# Patient Record
Sex: Male | Born: 1980 | Race: White | Hispanic: No | Marital: Single | Smoking: Current every day smoker
Health system: Southern US, Community
[De-identification: ages and names within clinical notes are randomized; demographics above are authoritative.]

## PROBLEM LIST (undated history)

## (undated) DIAGNOSIS — B1920 Unspecified viral hepatitis C without hepatic coma: Secondary | ICD-10-CM

## (undated) DIAGNOSIS — B009 Herpesviral infection, unspecified: Secondary | ICD-10-CM

## (undated) DIAGNOSIS — F1910 Other psychoactive substance abuse, uncomplicated: Secondary | ICD-10-CM

## (undated) HISTORY — PX: HAND SURGERY: SHX662

---

## 2011-10-09 ENCOUNTER — Encounter (HOSPITAL_BASED_OUTPATIENT_CLINIC_OR_DEPARTMENT_OTHER): Payer: Self-pay | Admitting: *Deleted

## 2011-10-09 ENCOUNTER — Emergency Department (INDEPENDENT_AMBULATORY_CARE_PROVIDER_SITE_OTHER): Payer: BC Managed Care – PPO

## 2011-10-09 ENCOUNTER — Emergency Department (HOSPITAL_BASED_OUTPATIENT_CLINIC_OR_DEPARTMENT_OTHER)
Admission: EM | Admit: 2011-10-09 | Discharge: 2011-10-09 | Disposition: A | Discharge status: Home / Self Care | Payer: BC Managed Care – PPO | Attending: Emergency Medicine | Admitting: Emergency Medicine

## 2011-10-09 DIAGNOSIS — S0990XA Unspecified injury of head, initial encounter: Secondary | ICD-10-CM

## 2011-10-09 DIAGNOSIS — F1120 Opioid dependence, uncomplicated: Secondary | ICD-10-CM | POA: Insufficient documentation

## 2011-10-09 DIAGNOSIS — R112 Nausea with vomiting, unspecified: Secondary | ICD-10-CM | POA: Insufficient documentation

## 2011-10-09 DIAGNOSIS — R61 Generalized hyperhidrosis: Secondary | ICD-10-CM | POA: Insufficient documentation

## 2011-10-09 DIAGNOSIS — R1110 Vomiting, unspecified: Secondary | ICD-10-CM

## 2011-10-09 DIAGNOSIS — F19939 Other psychoactive substance use, unspecified with withdrawal, unspecified: Secondary | ICD-10-CM | POA: Insufficient documentation

## 2011-10-09 DIAGNOSIS — F17200 Nicotine dependence, unspecified, uncomplicated: Secondary | ICD-10-CM | POA: Insufficient documentation

## 2011-10-09 DIAGNOSIS — X58XXXA Exposure to other specified factors, initial encounter: Secondary | ICD-10-CM

## 2011-10-09 DIAGNOSIS — R569 Unspecified convulsions: Secondary | ICD-10-CM

## 2011-10-09 DIAGNOSIS — R109 Unspecified abdominal pain: Secondary | ICD-10-CM | POA: Insufficient documentation

## 2011-10-09 DIAGNOSIS — F1123 Opioid dependence with withdrawal: Secondary | ICD-10-CM

## 2011-10-09 DIAGNOSIS — R197 Diarrhea, unspecified: Secondary | ICD-10-CM | POA: Insufficient documentation

## 2011-10-09 DIAGNOSIS — F1193 Opioid use, unspecified with withdrawal: Secondary | ICD-10-CM

## 2011-10-09 DIAGNOSIS — R6883 Chills (without fever): Secondary | ICD-10-CM | POA: Insufficient documentation

## 2011-10-09 HISTORY — DX: Other psychoactive substance abuse, uncomplicated: F19.10

## 2011-10-09 LAB — DIFFERENTIAL
Basophils Relative: 0 % (ref 0–1)
Monocytes Absolute: 0.7 10*3/uL (ref 0.1–1.0)
Monocytes Relative: 4 % (ref 3–12)
Neutro Abs: 13.2 10*3/uL — ABNORMAL HIGH (ref 1.7–7.7)

## 2011-10-09 LAB — BASIC METABOLIC PANEL
BUN: 17 mg/dL (ref 6–23)
CO2: 23 mEq/L (ref 19–32)
Chloride: 99 mEq/L (ref 96–112)
Creatinine, Ser: 1 mg/dL (ref 0.50–1.35)
GFR calc Af Amer: >90 mL/min (ref >90)

## 2011-10-09 LAB — CBC
HCT: 46.8 % (ref 39.0–52.0)
Hemoglobin: 17.2 g/dL — ABNORMAL HIGH (ref 13.0–17.0)
MCHC: 36.8 g/dL — ABNORMAL HIGH (ref 30.0–36.0)
MCV: 90.3 fL (ref 78.0–100.0)

## 2011-10-09 MED ORDER — CLONIDINE HCL 0.2 MG PO TABS: 0.1000 mg | ORAL_TABLET | Freq: Two times a day (BID) | ORAL | Status: DC

## 2011-10-09 MED ORDER — ONDANSETRON HCL 4 MG/2ML IJ SOLN
4.0000 mg | Freq: Once | INTRAMUSCULAR | Status: AC
  Administered 2011-10-09: 4 mg via INTRAVENOUS
  Filled 2011-10-09: qty 2

## 2011-10-09 MED ORDER — CLONIDINE HCL 0.1 MG PO TABS
0.1000 mg | ORAL_TABLET | Freq: Once | ORAL | Status: AC
  Administered 2011-10-09: 0.1 mg via ORAL
  Filled 2011-10-09: qty 1

## 2011-10-09 MED ORDER — PROMETHAZINE HCL 25 MG PO TABS: 25.0000 mg | ORAL_TABLET | Freq: Four times a day (QID) | ORAL | Status: AC | PRN

## 2011-10-09 MED ORDER — LORAZEPAM 2 MG/ML IJ SOLN
1.0000 mg | Freq: Once | INTRAMUSCULAR | Status: AC
  Administered 2011-10-09: 1 mg via INTRAVENOUS
  Filled 2011-10-09: qty 1

## 2011-10-09 MED ORDER — PROMETHAZINE HCL 25 MG/ML IJ SOLN
25.0000 mg | Freq: Once | INTRAMUSCULAR | Status: AC
  Administered 2011-10-09: 25 mg via INTRAVENOUS
  Filled 2011-10-09: qty 1

## 2011-10-09 MED ORDER — SODIUM CHLORIDE 0.9 % IV BOLUS (SEPSIS): 1000.0000 mL | Freq: Once | INTRAVENOUS | Status: DC

## 2011-10-09 MED ORDER — SODIUM CHLORIDE 0.9 % IV SOLN
INTRAVENOUS | Status: DC
  Administered 2011-10-09: 21:00:00 via INTRAVENOUS

## 2011-10-09 MED ORDER — PROMETHAZINE HCL 25 MG RE SUPP: 25.0000 mg | Freq: Four times a day (QID) | RECTAL | Status: AC | PRN

## 2011-10-09 NOTE — ED Provider Notes (Signed)
History     CSN: [id number]  Arrival date & time 10/09/11  1701   First MD Initiated Contact with Patient 10/09/11 1747      Chief Complaint  Patient presents with  . Withdrawal    (Consider location/radiation/quality/duration/timing/severity/associated sxs/prior treatment) Patient is a 31 y.o. male presenting with vomiting. The history is provided by the patient.  Emesis  This is a new (Patient states he strained it on heroin and quit cold [place] on Monday. His symptoms began last night with vomiting, nausea, diarrhea and abdominal cramping. He states he's been unable to hold anything down.) problem. The current episode started 12 to 24 hours ago. The problem occurs continuously. The problem has not changed since onset.The emesis has an appearance of stomach contents. There has been no fever. Associated symptoms include chills, diarrhea and sweats. Pertinent negatives include no cough and no URI. Associated symptoms comments: Abdominal cramps. Risk factors: none.    Past Medical History  Diagnosis Date  . Drug abuse     Past Surgical History  Procedure Date  . Hand surgery     Left    History reviewed. No pertinent family history.  History  Substance Use Topics  . Smoking status: Current Everyday Smoker  . Smokeless tobacco: Not on file  . Alcohol Use: Yes      Review of Systems  Constitutional: Positive for chills.  Respiratory: Negative for cough.   Gastrointestinal: Positive for vomiting and diarrhea.  All other systems reviewed and are negative.    Allergies  Review of patient's allergies indicates no known allergies.  Home Medications   Current Outpatient Rx  Name Route Sig Dispense Refill  . ALPRAZOLAM 2 MG PO TABS Oral Take 2 mg by mouth 3 (three) times daily.    . TRAZODONE HCL 150 MG PO TABS Oral Take 150-200 mg by mouth at bedtime as needed. For sleep    . VALACYCLOVIR HCL 500 MG PO TABS Oral Take 500 mg by mouth daily.      BP 105/72  Pulse  81  Temp 98.1 F (36.7 C)  Resp 24  SpO2 99%  Physical Exam  Constitutional: He is oriented to person, place, and time. He appears well-developed and well-nourished. He appears distressed.  HENT:  Head: Normocephalic and atraumatic.    Right Ear: External ear normal.  Left Ear: External ear normal.  Mouth/Throat: Mucous membranes are dry.  Eyes: Conjunctivae and EOM are normal. Pupils are equal, round, and reactive to light. Right eye exhibits no discharge.  Neck: Normal range of motion. Neck supple.  Cardiovascular: Normal rate, regular rhythm, normal heart sounds and intact distal pulses.   No murmur heard. Pulmonary/Chest: Tachypnea noted. No respiratory distress. He has no decreased breath sounds. He has no wheezes. He has no rhonchi. He has no rales.  Abdominal: Soft. There is no tenderness. There is no rebound and no guarding.  Musculoskeletal: Normal range of motion. He exhibits no edema and no tenderness.  Neurological: He is alert and oriented to person, place, and time.  Skin: Skin is warm and dry. No rash noted.  Psychiatric: He has a normal mood and affect.    ED Course  Procedures (including critical care time)  Labs Reviewed  CBC - Abnormal; Notable for the following:    WBC 15.4 (*)    Hemoglobin 17.2 (*)    MCHC 36.8 (*)    All other components within normal limits  DIFFERENTIAL - Abnormal; Notable for the following:  Neutrophils Relative 86 (*)    Neutro Abs 13.2 (*)    Lymphocytes Relative 10 (*)    All other components within normal limits  BASIC METABOLIC PANEL - Abnormal; Notable for the following:    Potassium 3.4 (*)    Glucose, Bld 119 (*)    All other components within normal limits   Ct Head Wo Contrast  10/09/2011  *RADIOLOGY REPORT*  Clinical Data: Seizures.  Head injury.  Heroin withdrawal.  Hit left side of head and face.  CT HEAD WITHOUT CONTRAST  Technique:  Contiguous axial images were obtained from the base of the skull through the  vertex without contrast.  Comparison: None  Findings: There is no intra or extra-axial fluid collection or mass lesion.  The basilar cisterns and ventricles have a normal appearance.  There is no CT evidence for acute infarction or hemorrhage.  Bone windows show no calvarial fracture.  Visualized sinuses are clear.  IMPRESSION: Negative exam.  Original Report Authenticated By: Patterson Hammersmith, M.D.     1. Opiate withdrawal   2. Vomiting and diarrhea       MDM   Patient presents with complaints of heroin withdrawal. He is having nausea, vomiting, diarrhea, chills and insomnia. Patient last used on Monday. He states he's been unable to hold anything down and currently he had been using heroin daily for the last month and a half. Patient has normal vital signs but is stressed on exam. No particular abdominal pain however he does have an abrasion on his face and states that he thinks he may have had a seizure on Friday and hit his head. Patient given supportive care IV fluids, Zofran and clonidine. CBC, BMP, head CT pending.  Labs are within normal limits other than a leukocytosis which is most likely an acute phase reactant. Head CT was negative. After IV fluids, Ativan, Zofran, Phenergan. Patient is tolerating by mouth's. Discussed with him detox which he initially wanted that and he states he wanted to leave and followup on his own.        Gwyneth Sprout, MD 10/09/11 [phone]

## 2011-10-09 NOTE — ED Notes (Addendum)
Patient states that is having heroin withdrawal, last used this weekend, vomiting, states he has not slept in two days

## 2011-10-09 NOTE — Discharge Instructions (Signed)
B.R.A.T. Diet Your doctor has recommended the B.R.A.T. diet for you or your child until the condition improves. This is often used to help control diarrhea and vomiting symptoms. If you or your child can tolerate clear liquids, you may have:  Bananas.   Rice.   Applesauce.   Toast (and other simple starches such as crackers, potatoes, noodles).  Be sure to avoid dairy products, meats, and fatty foods until symptoms are better. Fruit juices such as apple, grape, and prune juice can make diarrhea worse. Avoid these. Continue this diet for 2 days or as instructed by your caregiver. Document Released: 07/16/2005 Document Revised: 07/05/2011 Document Reviewed: 01/02/2007 [facility] Patient Information 2012 [place], [place].  RESOURCE GUIDE  Dental Problems  Patients with Medicaid: [facility] [phone] [address]                                           [phone] [address] Phone:  [phone]                                                  Phone:  [phone]  If unable to pay or uninsured, contact:  [facility] or [facility]. to become qualified for the adult dental clinic.  Chronic Pain Problems Contact [facility]  [phone] Patients need to be referred by their primary care doctor.  Insufficient Money for Medicine Contact United Way:  call "211" or [facility] [phone].  No Primary Care Doctor Call [facility]  [phone] Other agencies that provide inexpensive medical care    [facility]  [phone]    [facility]  [phone]    [facility]  [phone]    [facility]  [phone]    [facility]  [phone]    [facility]  [phone]  Psychological Services [facility] Behavioral Health  [phone] [facility]  [phone] [facility] Mental Health   [phone] (emergency services [phone])  Substance Abuse Resources Alcohol and  Drug Services  [phone] [facility] [phone] [facility] [phone] Floydene Flock [phone] Residential & Outpatient Substance Abuse Program  [phone]  Abuse/Neglect [facility] Child Abuse Hotline 518-051-7072 [facility] Child Abuse Hotline [phone] (After Hours)  Emergency Shelter [facility] [phone]  Maternity Homes Room at the [place] [facility] [place] [phone] [facility] [phone]  MRSA Hotline #:   252-620-0120    [facility] Resources  [facility] [place]     [employer]                          [facility] [address] [place]                       [address]      [address]  [address]  Sela Hua Phone:  [phone]                                   Phone:  [phone]                 Phone:  [place] Phone:  [place] [phone] (After Hours)

## 2011-10-09 NOTE — ED Notes (Addendum)
Pt tolerated sprite po-pt states that he has ride en route for d/c-states he prefers to be d/c-talk with his father about possible outpt rehab tx-EDP [facility] made aware of pt request and is agreeable-[facility] that was contacted to see pt notified that pt will be d/c

## 2012-03-12 ENCOUNTER — Encounter (HOSPITAL_COMMUNITY): Payer: Self-pay | Admitting: *Deleted

## 2012-03-12 ENCOUNTER — Emergency Department (HOSPITAL_COMMUNITY)
Admission: EM | Admit: 2012-03-12 | Discharge: 2012-03-13 | Disposition: A | Discharge status: Other Acute Inpatient Hospital | Payer: BC Managed Care – PPO | Attending: Emergency Medicine | Admitting: Emergency Medicine

## 2012-03-12 DIAGNOSIS — F13239 Sedative, hypnotic or anxiolytic dependence with withdrawal, unspecified: Secondary | ICD-10-CM

## 2012-03-12 DIAGNOSIS — F1520 Other stimulant dependence, uncomplicated: Secondary | ICD-10-CM | POA: Insufficient documentation

## 2012-03-12 DIAGNOSIS — F13939 Sedative, hypnotic or anxiolytic use, unspecified with withdrawal, unspecified: Secondary | ICD-10-CM

## 2012-03-12 DIAGNOSIS — F19939 Other psychoactive substance use, unspecified with withdrawal, unspecified: Secondary | ICD-10-CM | POA: Insufficient documentation

## 2012-03-12 DIAGNOSIS — B1920 Unspecified viral hepatitis C without hepatic coma: Secondary | ICD-10-CM

## 2012-03-12 DIAGNOSIS — E876 Hypokalemia: Secondary | ICD-10-CM

## 2012-03-12 HISTORY — DX: Unspecified viral hepatitis C without hepatic coma: B19.20

## 2012-03-12 HISTORY — DX: Herpesviral infection, unspecified: B00.9

## 2012-03-12 LAB — COMPREHENSIVE METABOLIC PANEL WITH GFR
ALT: 120 U/L — ABNORMAL HIGH (ref 0–53)
AST: 68 U/L — ABNORMAL HIGH (ref 0–37)
Alkaline Phosphatase: 76 U/L (ref 39–117)
CO2: 29 meq/L (ref 19–32)
Chloride: 99 meq/L (ref 96–112)
GFR calc Af Amer: >90 mL/min (ref >90)
GFR calc non Af Amer: >90 mL/min (ref >90)
Glucose, Bld: 110 mg/dL — ABNORMAL HIGH (ref 70–99)
Potassium: 3 meq/L — ABNORMAL LOW (ref 3.5–5.1)
Sodium: 141 meq/L (ref 135–145)
Total Bilirubin: 0.6 mg/dL (ref 0.3–1.2)

## 2012-03-12 LAB — CBC
HCT: 45.1 % (ref 39.0–52.0)
Hemoglobin: 15.8 g/dL (ref 13.0–17.0)
MCH: 34.2 pg — ABNORMAL HIGH (ref 26.0–34.0)
MCHC: 35 g/dL (ref 30.0–36.0)
MCV: 97.6 fL (ref 78.0–100.0)
Platelets: 201 K/uL (ref 150–400)
RBC: 4.62 MIL/uL (ref 4.22–5.81)
RDW: 13 % (ref 11.5–15.5)
WBC: 14.2 K/uL — ABNORMAL HIGH (ref 4.0–10.5)

## 2012-03-12 LAB — RAPID URINE DRUG SCREEN, HOSP PERFORMED
Amphetamines: NOT DETECTED
Barbiturates: NOT DETECTED
Benzodiazepines: POSITIVE — AB
Cocaine: POSITIVE — AB
Opiates: POSITIVE — AB
Tetrahydrocannabinol: POSITIVE — AB

## 2012-03-12 LAB — COMPREHENSIVE METABOLIC PANEL
Albumin: 3.9 g/dL (ref 3.5–5.2)
BUN: 8 mg/dL (ref 6–23)
Calcium: 9 mg/dL (ref 8.4–10.5)
Creatinine, Ser: 0.83 mg/dL (ref 0.50–1.35)
Total Protein: 7.1 g/dL (ref 6.0–8.3)

## 2012-03-12 LAB — ETHANOL: Alcohol, Ethyl (B): <11 mg/dL (ref 0–11)

## 2012-03-12 LAB — ACETAMINOPHEN LEVEL: Acetaminophen (Tylenol), Serum: <15 ug/mL (ref 10–30)

## 2012-03-12 MED ORDER — ALUM & MAG HYDROXIDE-SIMETH 200-200-20 MG/5ML PO SUSP
30.0000 mL | ORAL | Status: DC | PRN
  Administered 2012-03-12: 30 mL via ORAL
  Filled 2012-03-12: qty 30

## 2012-03-12 MED ORDER — ACETAMINOPHEN 325 MG PO TABS: 650.0000 mg | ORAL_TABLET | ORAL | Status: DC | PRN

## 2012-03-12 MED ORDER — POTASSIUM CHLORIDE CRYS ER 20 MEQ PO TBCR
40.0000 meq | EXTENDED_RELEASE_TABLET | Freq: Once | ORAL | Status: AC
  Administered 2012-03-12: 40 meq via ORAL
  Filled 2012-03-12: qty 2

## 2012-03-12 MED ORDER — ONDANSETRON HCL 8 MG PO TABS: 4.0000 mg | ORAL_TABLET | Freq: Three times a day (TID) | ORAL | Status: DC | PRN

## 2012-03-12 MED ORDER — IBUPROFEN 200 MG PO TABS: 600.0000 mg | ORAL_TABLET | Freq: Three times a day (TID) | ORAL | Status: DC | PRN

## 2012-03-12 NOTE — ED Notes (Signed)
Behavioral health at bedside for pt evaluation. 

## 2012-03-12 NOTE — ED Notes (Signed)
Reports being in an outpatient rehab [facility], has not taken xanax since early yesterday and feels like he is withdrawing and was sent here. No acute distress noted at triage, calm and coopeartive, denies SI or HI.
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
## 2012-03-12 NOTE — ED Notes (Signed)
Patient dressed in paper blue scrubs and wanded by [facility] Security. Patient belongings removed from room and inventoried and placed in locker.

## 2012-03-12 NOTE — ED Provider Notes (Addendum)
History   This chart was scribed for Anthony Haney. Oletta Lamas, MD by Charolett Bumpers . The patient was seen in room TR10C/TR10C. Patient's care was started at 1436.    CSN: [id number]  Arrival date & time 03/12/12  1048   First MD Initiated Contact with Patient 03/12/12 1436      Chief Complaint  Patient presents with  . Medical Clearance    (Consider location/radiation/quality/duration/timing/severity/associated sxs/prior treatment) HPI Stuart Burgess Sheriff is a 31 y.o. male who presents to the Emergency Department for medical clearance. Pt reports that he checked into [facility] this morning, and was sent here for medical clearance. Pt report that he starting detox for xanax. Pt reports that he was taking 6 mg xanax with his last dose 2 days ago. Pt reports that he is starting to feel withdrawal symptoms and reports feeling anxious and shaky. Pt reports occasionally cocaine and alcohol use. Pt reports that he was prescribed xanax for anxiety and panic disorder. Pt denies any SI or HI. Pt has had withdrawal seizures in the past.    Past Medical History  Diagnosis Date  . Drug abuse   . Hepatitis C   . Herpes     Past Surgical History  Procedure Date  . Hand surgery     Left    History reviewed. No pertinent family history.  History  Substance Use Topics  . Smoking status: Current Everyday Smoker  . Smokeless tobacco: Not on file  . Alcohol Use: Yes      Review of Systems  Psychiatric/Behavioral: The patient is nervous/anxious.   All other systems reviewed and are negative.    Allergies  Review of patient's allergies indicates no known allergies.  Home Medications   Current Outpatient Rx  Name Route Sig Dispense Refill  . ALPRAZOLAM 2 MG PO TABS Oral Take 2 mg by mouth 3 (three) times daily.    . ADULT MULTIVITAMIN W/MINERALS CH Oral Take 1 tablet by mouth daily.    Marland Kitchen NABUMETONE 750 MG PO TABS Oral Take 750 mg by mouth 2 (two) times daily.    . TRAZODONE HCL 150 MG  PO TABS Oral Take 150-200 mg by mouth at bedtime as needed. For sleep    . VALACYCLOVIR HCL 500 MG PO TABS Oral Take 500 mg by mouth daily.      BP 111/63  Pulse 92  Temp 98.4 F (36.9 C) (Oral)  Resp 16  SpO2 96%  Physical Exam  Nursing note and vitals reviewed. Constitutional: He is oriented to person, place, and time. He appears well-developed and well-nourished. No distress.  HENT:  Head: Normocephalic and atraumatic.  Eyes: EOM are normal.  Neck: Neck supple. No tracheal deviation present.  Cardiovascular: Normal rate.   Pulmonary/Chest: Effort normal. No respiratory distress.  Musculoskeletal: Normal range of motion.  Neurological: He is alert and oriented to person, place, and time.       Grossly neurologically intact. Some shakiness in hands.   Skin: Skin is warm and dry.  Psychiatric: He has a normal mood and affect. His behavior is normal.       Psychologically normal.     ED Course  Procedures (including critical care time)  DIAGNOSTIC STUDIES: Oxygen Saturation is 96% on room air, adequate by my interpretation.    COORDINATION OF CARE:  14:50-Discussed planned course of treatment with the patient, who is agreeable at this time.   Results for orders placed during the hospital encounter of 03/12/12  CBC  Component Value Range   WBC 14.2 (*) 4.0 - 10.5 K/uL   RBC 4.62  4.22 - 5.81 MIL/uL   Hemoglobin 15.8  13.0 - 17.0 g/dL   HCT 16.1  09.6 - 04.5 %   MCV 97.6  78.0 - 100.0 fL   MCH 34.2 (*) 26.0 - 34.0 pg   MCHC 35.0  30.0 - 36.0 g/dL   RDW 40.9  81.1 - 91.4 %   Platelets 201  150 - 400 K/uL  COMPREHENSIVE METABOLIC PANEL      Component Value Range   Sodium 141  135 - 145 mEq/L   Potassium 3.0 (*) 3.5 - 5.1 mEq/L   Chloride 99  96 - 112 mEq/L   CO2 29  19 - 32 mEq/L   Glucose, Bld 110 (*) 70 - 99 mg/dL   BUN 8  6 - 23 mg/dL   Creatinine, Ser 7.82  0.50 - 1.35 mg/dL   Calcium 9.0  8.4 - 95.6 mg/dL   Total Protein 7.1  6.0 - 8.3 g/dL   Albumin  3.9  3.5 - 5.2 g/dL   AST 68 (*) 0 - 37 U/L   ALT 120 (*) 0 - 53 U/L   Alkaline Phosphatase 76  39 - 117 U/L   Total Bilirubin 0.6  0.3 - 1.2 mg/dL   GFR calc non Af Amer >90  >90 mL/min   GFR calc Af Amer >90  >90 mL/min  ETHANOL      Component Value Range   Alcohol, Ethyl (B) <11  0 - 11 mg/dL  ACETAMINOPHEN LEVEL      Component Value Range   Acetaminophen (Tylenol), Serum <15.0  10 - 30 ug/mL     1. Benzodiazepine withdrawal   2. Hypokalemia   3. Hepatitis C       MDM  I personally performed the services described in this documentation, which was scribed in my presence. The recorded information has been reviewed and considered.   Pt is medically cleared except K+ is low at 3.0.  Pt is given oral replacement.  Pt with prior h/o withdrawal seizures thus sent here as he was feeling anxious and tremulous.  Will carefully monitor here.  Mild increase in LFT's I think is consistent with his h/o Hep C.  I spoke to [place] with [facility].       Anthony Haney. Oletta Lamas, MD 03/12/12 1530  Anthony Haney. Briana Newman, MD 03/12/12 [phone]

## 2012-03-13 ENCOUNTER — Encounter (HOSPITAL_COMMUNITY): Payer: Self-pay | Admitting: Emergency Medicine

## 2012-03-13 ENCOUNTER — Inpatient Hospital Stay (HOSPITAL_COMMUNITY)
Admission: AD | Admit: 2012-03-13 | Discharge: 2012-03-17 | DRG: 747 | Payer: BC Managed Care – PPO | Source: Ambulatory Visit | Attending: Psychiatry | Admitting: Psychiatry

## 2012-03-13 DIAGNOSIS — Z79899 Other long term (current) drug therapy: Secondary | ICD-10-CM

## 2012-03-13 DIAGNOSIS — F1910 Other psychoactive substance abuse, uncomplicated: Secondary | ICD-10-CM | POA: Diagnosis present

## 2012-03-13 DIAGNOSIS — B1920 Unspecified viral hepatitis C without hepatic coma: Secondary | ICD-10-CM | POA: Diagnosis present

## 2012-03-13 DIAGNOSIS — F19939 Other psychoactive substance use, unspecified with withdrawal, unspecified: Principal | ICD-10-CM | POA: Diagnosis present

## 2012-03-13 DIAGNOSIS — G4700 Insomnia, unspecified: Secondary | ICD-10-CM | POA: Diagnosis present

## 2012-03-13 DIAGNOSIS — F1320 Sedative, hypnotic or anxiolytic dependence, uncomplicated: Secondary | ICD-10-CM | POA: Diagnosis present

## 2012-03-13 DIAGNOSIS — B009 Herpesviral infection, unspecified: Secondary | ICD-10-CM | POA: Diagnosis present

## 2012-03-13 MED ORDER — ONDANSETRON 4 MG PO TBDP: 4.0000 mg | ORAL_TABLET | Freq: Four times a day (QID) | ORAL | Status: DC | PRN

## 2012-03-13 MED ORDER — CLONIDINE HCL 0.1 MG PO TABS
0.1000 mg | ORAL_TABLET | Freq: Four times a day (QID) | ORAL | Status: DC
  Filled 2012-03-13 (×9): qty 1

## 2012-03-13 MED ORDER — ACETAMINOPHEN 325 MG PO TABS: 650.0000 mg | ORAL_TABLET | Freq: Four times a day (QID) | ORAL | Status: DC | PRN

## 2012-03-13 MED ORDER — TRAZODONE HCL 50 MG PO TABS: 150.0000 mg | ORAL_TABLET | Freq: Every evening | ORAL | Status: DC | PRN

## 2012-03-13 MED ORDER — DICYCLOMINE HCL 20 MG PO TABS
20.0000 mg | ORAL_TABLET | Freq: Four times a day (QID) | ORAL | Status: DC | PRN
  Administered 2012-03-13 – 2012-03-14 (×2): 20 mg via ORAL
  Filled 2012-03-13 (×2): qty 1

## 2012-03-13 MED ORDER — LOPERAMIDE HCL 2 MG PO CAPS: 2.0000 mg | ORAL_CAPSULE | ORAL | Status: DC | PRN

## 2012-03-13 MED ORDER — VALACYCLOVIR HCL 500 MG PO TABS
500.0000 mg | ORAL_TABLET | Freq: Every day | ORAL | Status: DC
  Administered 2012-03-13 – 2012-03-16 (×4): 500 mg via ORAL
  Filled 2012-03-13 (×4): qty 1
  Filled 2012-03-13: qty 14
  Filled 2012-03-13 (×2): qty 1

## 2012-03-13 MED ORDER — CLONIDINE HCL 0.1 MG PO TABS: 0.1000 mg | ORAL_TABLET | ORAL | Status: DC

## 2012-03-13 MED ORDER — TRAZODONE HCL 100 MG PO TABS
200.0000 mg | ORAL_TABLET | Freq: Every day | ORAL | Status: DC
  Administered 2012-03-13: 200 mg via ORAL
  Filled 2012-03-13 (×4): qty 2

## 2012-03-13 MED ORDER — THIAMINE HCL 100 MG/ML IJ SOLN: 100.0000 mg | Freq: Once | INTRAMUSCULAR | Status: DC

## 2012-03-13 MED ORDER — CHLORDIAZEPOXIDE HCL 25 MG PO CAPS
50.0000 mg | ORAL_CAPSULE | Freq: Three times a day (TID) | ORAL | Status: AC
  Administered 2012-03-14 – 2012-03-15 (×3): 50 mg via ORAL
  Filled 2012-03-13 (×2): qty 2

## 2012-03-13 MED ORDER — MAGNESIUM HYDROXIDE 400 MG/5ML PO SUSP: 30.0000 mL | Freq: Every day | ORAL | Status: DC | PRN

## 2012-03-13 MED ORDER — HYDROXYZINE HCL 25 MG PO TABS
25.0000 mg | ORAL_TABLET | Freq: Four times a day (QID) | ORAL | Status: DC | PRN
  Administered 2012-03-13 – 2012-03-16 (×8): 25 mg via ORAL
  Filled 2012-03-13: qty 56

## 2012-03-13 MED ORDER — CHLORDIAZEPOXIDE HCL 25 MG PO CAPS
50.0000 mg | ORAL_CAPSULE | Freq: Four times a day (QID) | ORAL | Status: AC
  Administered 2012-03-13 – 2012-03-14 (×5): 50 mg via ORAL
  Filled 2012-03-13: qty 1
  Filled 2012-03-13 (×2): qty 2
  Filled 2012-03-13: qty 1
  Filled 2012-03-13: qty 2
  Filled 2012-03-13: qty 1
  Filled 2012-03-13 (×3): qty 2

## 2012-03-13 MED ORDER — HYDROXYZINE HCL 25 MG PO TABS: 25.0000 mg | ORAL_TABLET | Freq: Four times a day (QID) | ORAL | Status: DC | PRN

## 2012-03-13 MED ORDER — NICOTINE 21 MG/24HR TD PT24
21.0000 mg | MEDICATED_PATCH | Freq: Every day | TRANSDERMAL | Status: DC
  Administered 2012-03-13 – 2012-03-16 (×4): 21 mg via TRANSDERMAL
  Filled 2012-03-13 (×8): qty 1

## 2012-03-13 MED ORDER — METHOCARBAMOL 500 MG PO TABS
500.0000 mg | ORAL_TABLET | Freq: Three times a day (TID) | ORAL | Status: DC | PRN
  Administered 2012-03-13 – 2012-03-16 (×5): 500 mg via ORAL
  Filled 2012-03-13 (×5): qty 1

## 2012-03-13 MED ORDER — CHLORDIAZEPOXIDE HCL 25 MG PO CAPS
50.0000 mg | ORAL_CAPSULE | ORAL | Status: AC
  Administered 2012-03-15 – 2012-03-16 (×2): 50 mg via ORAL
  Filled 2012-03-13 (×2): qty 2

## 2012-03-13 MED ORDER — CHLORDIAZEPOXIDE HCL 25 MG PO CAPS: 50.0000 mg | ORAL_CAPSULE | Freq: Every day | ORAL | Status: DC

## 2012-03-13 MED ORDER — CHLORDIAZEPOXIDE HCL 25 MG PO CAPS
50.0000 mg | ORAL_CAPSULE | Freq: Once | ORAL | Status: AC
  Administered 2012-03-14: 50 mg via ORAL

## 2012-03-13 MED ORDER — ADULT MULTIVITAMIN W/MINERALS CH
1.0000 | ORAL_TABLET | Freq: Every day | ORAL | Status: DC
  Administered 2012-03-13 – 2012-03-16 (×4): 1 via ORAL
  Filled 2012-03-13 (×6): qty 1

## 2012-03-13 MED ORDER — NAPROXEN 500 MG PO TABS
500.0000 mg | ORAL_TABLET | Freq: Two times a day (BID) | ORAL | Status: DC | PRN
  Administered 2012-03-15 – 2012-03-16 (×2): 500 mg via ORAL
  Filled 2012-03-13: qty 1
  Filled 2012-03-13: qty 28
  Filled 2012-03-13 (×2): qty 1

## 2012-03-13 MED ORDER — ALUM & MAG HYDROXIDE-SIMETH 200-200-20 MG/5ML PO SUSP: 30.0000 mL | ORAL | Status: DC | PRN

## 2012-03-13 MED ORDER — CHLORDIAZEPOXIDE HCL 25 MG PO CAPS
25.0000 mg | ORAL_CAPSULE | Freq: Four times a day (QID) | ORAL | Status: AC | PRN
  Filled 2012-03-13: qty 1

## 2012-03-13 MED ORDER — VITAMIN B-1 100 MG PO TABS
100.0000 mg | ORAL_TABLET | Freq: Every day | ORAL | Status: DC
  Administered 2012-03-14 – 2012-03-16 (×3): 100 mg via ORAL
  Filled 2012-03-13 (×5): qty 1

## 2012-03-13 MED ORDER — CLONIDINE HCL 0.1 MG PO TABS: 0.1000 mg | ORAL_TABLET | Freq: Every day | ORAL | Status: DC

## 2012-03-13 NOTE — ED Notes (Signed)
Security is en route to pick up the patient for transport to [facility].

## 2012-03-13 NOTE — Progress Notes (Signed)
D:Patient seen on 300 hallway at the beginning of this shift. He reported a rough day. Pt stated he had a pain 8 on a scale of 1-10 with 10 the worst on his legs. His mood and affect flat and depressed, had running nose, stated he had chills and appeared to be going through rough withdrawals like he said. A: Writer offered Robaxin 500 mg for the pain. Encouraged patient to attend Karaoke after receiving his medications ND to return for his HS medications after Karaoke at 2130. R: Patient received medication without difficulty. Tolerated medication and Q 15 minute check continues to maintain safety.

## 2012-03-13 NOTE — Tx Team (Signed)
Initial Interdisciplinary Treatment Plan  PATIENT STRENGTHS: (choose at least two) Ability for insight Average or above average intelligence Capable of independent living Communication skills Motivation for treatment/growth Supportive family/friends  PATIENT STRESSORS: Financial difficulties Legal issue Substance abuse   PROBLEM LIST: Problem List/Patient Goals Date to be addressed Date deferred Reason deferred Estimated date of resolution  Substance Abuse 03/13/12     Depression 03/13/12                                                DISCHARGE CRITERIA:  Ability to meet basic life and health needs Improved stabilization in mood, thinking, and/or behavior Motivation to continue treatment in a less acute level of care Verbal commitment to aftercare and medication compliance Withdrawal symptoms are absent or subacute and managed without 24-hour nursing intervention  PRELIMINARY DISCHARGE PLAN: Attend aftercare/continuing care group Outpatient therapy Return to previous living arrangement  PATIENT/FAMIILY INVOLVEMENT: This treatment plan has been presented to and reviewed with the patient, Anthony Haney, and/or family member, .  The patient and family have been given the opportunity to ask questions and make suggestions.  Anthony Haney 03/13/2012, 3:42 AM

## 2012-03-13 NOTE — Progress Notes (Signed)
Psychoeducational Group Note  Date:  03/13/2012 Time:  1100   Group Topic/Focus:  Overcoming Stress:   The focus of this group is to define stress and help patients assess their triggers.  Participation Level: Did Not Attend  Participation Quality:  Not Applicable  Affect:  Not Applicable  Cognitive:  Not Applicable  Insight:  Not Applicable  Engagement in Group: Not Applicable  Additional Comments:  Pt did not attend group. Pt remained in bed because of blood pressure.  Sharyn Lull 03/13/2012, 11:39 AM

## 2012-03-13 NOTE — Progress Notes (Signed)
03/13/2012         Time: 1500      Group Topic/Focus: The focus of this group is on enhancing the patient's understanding of leisure, barriers to leisure, and the importance of engaging in positive leisure activities upon discharge for improved total health.  Participation Level: Did not attend  Participation Quality: Not Applicable  Affect: Not Applicable  Cognitive: Not Applicable   Additional Comments: Patient in bed.    Xavier Munger 03/13/2012 3:51 PM  
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
## 2012-03-13 NOTE — Treatment Plan (Signed)
Interdisciplinary Treatment Plan Update (Adult)  Date: 03/13/2012  Time Reviewed: 5:07 PM   Progress in Treatment: Attending groups: No Participating in groups: No Taking medication as prescribed: Yes Tolerating medication: Yes   Family/Significant other contact made:  No Patient understands diagnosis:  Yes  As evidenced by asking for help with benzo withdrawal Discussing patient identified problems/goals with staff:  Yes  See below Medical problems stabilized or resolved:  Yes Denies suicidal/homicidal ideation: Yes  With Dr Issues/concerns per patient self-inventory:  Not filled out Other:  New problem(s) identified: N/A  Reason for Continuation of Hospitalization: Medication stabilization Withdrawal symptoms  Interventions implemented related to continuation of hospitalization: Librium taper  Encourage group attendance and participation  Additional comments:  Estimated length of stay: 3 days  Discharge Plan:transfer to [facility] rehab  New goal(s): N/A  Review of initial/current patient goals per problem list:   1.  Goal(s):Identify comprehensive sobriety plan  Met:  Yes  Target date:8/15  As evidenced WG:NFAOZH he wants to get into [facility] rehab  2.  Goal (s): Safely detox from benzos  Met:  No  Target date:8/19  As evidenced YQ:MVHQIO vitals, CIWA score of 0  3.  Goal(s): Stabilize mood  Met:  No  Target date:8/19  As evidenced by:[name] will rate his depression at a 4 or less  4.  Goal(s):  Met:  No  Target date:  As evidenced by:  Attendees: Patient:     Family:     Physician:  Lupe Carney 03/13/2012 5:07 PM   Nursing:    03/13/2012 5:07 PM   Case Manager:  Richelle Ito, LCSW 03/13/2012 5:07 PM   Counselor:   03/13/2012 5:07 PM   Other:     Other:     Other:     Other:      Scribe for Treatment Team:   Ida Rogue, 03/13/2012 5:07 PM

## 2012-03-13 NOTE — ED Notes (Signed)
Called and gave report to [place], Charity fundraiser at [facility].

## 2012-03-13 NOTE — Progress Notes (Signed)
[facility] Adult Inpatient Family/Significant Other Suicide Prevention Education  Suicide Prevention Education:  Education Completed; Ames Dura, patient's girlfriend, at [phone] and [phone] has been identified as the person(s) who will aid the patient in the event of a mental health crisis (suicidal ideations/suicide attempt).  With written consent from the patient, the family member/significant other has been provided the following suicide prevention education, prior to the and/or following the discharge of the patient.  The suicide prevention education provided includes the following:  Suicide risk factors  Suicide prevention and interventions  National Suicide Hotline telephone number  [facility] assessment telephone number  [facility] Emergency Assistance 911  [facility] and/or Residential Mobile Crisis Unit telephone number  Request made of family/significant other to:  Remove weapons (e.g., guns, rifles, knives), all items previously/currently identified as safety concern.    Remove drugs/medications (over-the-counter, prescriptions, illicit drugs), all items previously/currently identified as a safety concern.  Verlon Au knows of no access to firearms that patient may have.   The family member/significant other verbalizes understanding of the suicide prevention education information provided.  The family member/significant other agrees to remove the items of safety concern listed above.  Clide Dales 03/13/2012, 5:01 PM

## 2012-03-13 NOTE — ED Provider Notes (Signed)
Patient has been accepted at behavioral health hospital by Dr. Catha Brow.  BP 119/62  Pulse 62  Temp 98.4 F (36.9 C) (Oral)  Resp 18  SpO2 99%   Glynn Octave, MD 03/13/12 0040

## 2012-03-13 NOTE — Discharge Planning (Signed)
Found pt in bed, awake, engageable.  C/O withdrawal symptoms from benzos, opiates.  Went to [facility] for admission, and they sent him here for detox.  Spoke to [facility], who gave him admission date of Monday.
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
## 2012-03-13 NOTE — Progress Notes (Signed)
Patient ID: Anthony Haney, male   DOB: 1981-03-29, 30 y.o.   MRN: [id number] Patient's self inventory sheet, patient has poor sleep, poor appetite, low energy level, good attention span.  Rated depression #7, hopelessness #5.  Has experienced tremors, chilling, craving with withdrawals in past 24 hours.  Denied SI.   Has felt lightheaded, pain, dizzy, headache, blurred vision in past 24 hours.  Pain goal #5.  Worst pain #8.  After discharge, plans to go to rehab, meetings, support groups.  No questions for staff at this time. Does have discharge plans.  No problems taking meds after discharge.   [facility] Medications librium and clonidine were not given to patient this morning.  BP was too low, discussed with Charge Nurse and MD informed.  Medication clonidine not given at noon, charge nurse and MD informed.  Librium was given at lunch.   Patient has clonidine patch which was applied yesterday.  Patient has been resting in bed most of the day.  Has not attended groups.  Staff has continually  provided patient with ginger ale/gatorade today.   MD will talk to patient today.

## 2012-03-13 NOTE — BHH Suicide Risk Assessment (Signed)
Suicide Risk Assessment  Admission Assessment      Demographic factors:  See chart.  Current Mental Status:  Patient seen and evaluated. Chart reviewed. Patient stated that his mood was "ok".  Sig w/d s/s noted including shakes, anxiety, insomnia and sweats. His affect was mood congruent and euthymic. He denied any current thoughts of self injurious behavior, suicidal ideation or homicidal ideation. He denied any significant depressive signs or symptoms at this time. There were no auditory or visual hallucinations, paranoia, delusional thought processes, or mania noted.  Thought process was linear and goal directed.  No psychomotor agitation or retardation was noted. His speech was normal rate, tone and volume. Eye contact was good. Judgment and insight are fair.  Patient has been up and engaged on the unit.  No acute safety concerns reported from team.  Loss Factors: Decrease in vocational status;Legal issues;Financial problems / change in socioeconomic status  Historical Factors: Hx w/d seizures; denied Hx SI/attempts/plans  Risk Reduction Factors: Sense of responsibility to family;Positive therapeutic relationship  CLINICAL FACTORS: Benzodiazepine Use & W/D Disorders; HepC; Herpes; Insomnia  COGNITIVE FEATURES THAT CONTRIBUTE TO RISK: none.  SUICIDE RISK: Pt viewed as a chronic moderate increased risk of harm to self in light of his past hx and risk factors.  No acute safety concerns on the unit.  Pt contracting for safety and in need of crisis stabilization, detox & Tx.  PLAN OF CARE: Discontinued clonidine taper as not indicated.  Increased trazodone per outpt dosing.  Pt with hx of w/d seizures, will keep 50mg  librium taper.  Discussed benefits of switching to Ativan in light of HepC, but pt preferred continuing Librium.  Pt admitted for crisis stabilization and treatment.  Please see orders.   Medications reviewed with pt and medication education provided. Will continue q15 minute checks  per unit protocol.  No clinical indication for one on one level of observation at this time.  Pt contracting for safety.  Mental health treatment, medication management and continued sobriety will mitigate against the increased risk of harm to self and/or others.  Discussed the importance of recovery with pt, as well as, tools to move forward in a healthy & safe manner.  Pt agreeable with the plan.  Discussed with the team. Transfer to [facility] once stable.   Anthony Haney 03/13/2012, 3:23 PM

## 2012-03-14 MED ORDER — TRAZODONE HCL 150 MG PO TABS
250.0000 mg | ORAL_TABLET | Freq: Every day | ORAL | Status: DC
  Administered 2012-03-14: 250 mg via ORAL
  Filled 2012-03-14 (×4): qty 1

## 2012-03-14 NOTE — Progress Notes (Signed)
Pt. Stated he wanted meds to make him sleep. Participated in group and currently is in the dayroom. No SI or HI and contracts for safety,
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
## 2012-03-14 NOTE — Progress Notes (Signed)
[facility] MD Progress Note  03/14/2012 4:22 PM   S/O: Patient seen and evaluated. Chart reviewed. Patient stated that his mood was "allright". Sig w/d s/s noted including shakes, chills, nausea, anxiety, insomnia and sweats. His affect was mood congruent and tired. He denied any current thoughts of self injurious behavior, suicidal ideation or homicidal ideation. He denied any significant depressive signs or symptoms at this time. There were no auditory or visual hallucinations, paranoia, delusional thought processes, or mania noted. Thought process was linear and goal directed. No psychomotor agitation or retardation was noted. His speech was normal rate, tone and volume. Eye contact was good. Judgment and insight are fair. Patient has been up and engaged on the unit. No acute safety concerns reported from team.   Sleep:  Number of Hours: 3.75    Vital Signs:Blood pressure 150/55, pulse 139, temperature 98.2 F (36.8 C), temperature source Oral, resp. rate 18, height 5\' 6"  (1.676 m), weight 53.978 kg (119 lb).  Lab Results: No results found for this or any previous visit (from the past 48 hour(s)).  Physical Findings: AIMS: Facial and Oral Movements Muscles of Facial Expression: None, normal Lips and Perioral Area: None, normal Jaw: None, normal Tongue: None, normal,Extremity Movements Upper (arms, wrists, hands, fingers): None, normal Lower (legs, knees, ankles, toes): None, normal, Trunk Movements Neck, shoulders, hips: None, normal, Overall Severity Severity of abnormal movements (highest score from questions above): None, normal Incapacitation due to abnormal movements: None, normal Patient's awareness of abnormal movements (rate only patient's report): No Awareness, Dental Status Current problems with teeth and/or dentures?: No Does patient usually wear dentures?: No  CIWA:  CIWA-Ar Total: 2  COWS:  COWS Total Score: 3   CLINICAL FACTORS: Benzodiazepine Use & W/D Disorders; HepC;  Herpes; Insomnia  Plan: Pt seen and evaluated in treatment team.  Reviewed short term and long term goals, medications, current treatment in the hospital and acute/chronic safety.  Pt denied any current thoughts of self harm, suicidal ideation or homicidal ideation.  Contracted for safety on the unit.  No acute issues noted.  VS reviewed with team.  Pt agreeable with treatment plan, see orders. Increased Trazodone for sleep and pt accepted to [facility] residential on Monday.  Needs to be ready for discharge Sunday afternoon with orders and SRA for early Monday morning.  Lupe Carney 03/14/2012, 4:22 PM

## 2012-03-14 NOTE — Progress Notes (Signed)
D-Patient is somewhat isolative to room this shift with minimal group participation. A-Rates depression and hopelessness at 7. C/O tremors,chilling and diarrhea r/t withdrawal. Agreeable to treatment team plan. Denies SI.  Reports good appetite >75% and good energy level.  R- Compliant with scheduled medications.  Support and encouragement given.  Continue current POC and evaluation of treatment goals. Continue 15' checks for safety.

## 2012-03-14 NOTE — Progress Notes (Signed)
Psychoeducational Group Note  Date:  03/14/2012 Time:  1100  Group Topic/Focus:  Relapse Prevention Planning:   The focus of this group is to define relapse and discuss the need for planning to combat relapse.  Participation Level:  Minimal  Participation Quality:  Appropriate, Intrusive and Resistant  Affect:  Appropriate  Cognitive:  Appropriate  Insight:  Limited  Engagement in Group:  Limited  Additional Comments:  Pt attended group but participation was limited. Group discussion continued to focus on wants and demands from [place] even when this Clinical research associate redirected conversation.   Dalia Heading 03/14/2012, 4:15 PM

## 2012-03-14 NOTE — Progress Notes (Signed)
[facility] Group Notes:  (Counselor/Nursing/MHT/Case Management/Adjunct)  03/14/2012   Type of Therapy:  Group Therapy 1:15 - 2:30  Participation Level:  Active  Participation Quality:  Intrusive, Redirectable and Sharing  Affect:  Anxious and Irritable  Cognitive:  Alert and Oriented  Insight:  Limited  Engagement in Group:  Good  Engagement in Therapy:  Limited  Modes of Intervention:  Clarification, Limit-setting, Socialization and Support  Summary of Progress/Problems: Group session included an educational portion on Post Acute Withdrawal Syndrome (PAWS) . Several group members were frustrated and angry and needed to vent.   Greer Pickerel shared "it would be helpful to have a group in which patients could vent their problems and not get feedback."  Greer Pickerel also shared "it doesn't help to have all these groups and staff who thinks they know everything and have not been through what we have." Patient reiterated multiple times that talk of PAWS was making him "want to use."   Clide Dales   03/14/2012, 3:12 PM

## 2012-03-14 NOTE — Progress Notes (Signed)
Patient did attend the evening Karaoke group.  Pt was attentive, supportive, and participated by singing a song with another patient.

## 2012-03-14 NOTE — Progress Notes (Signed)
Psychoeducational Group Note  Date:  03/14/2012 Time:  1000  Group Topic/Focus:  Relapse Prevention Planning:   The focus of this group is to define relapse and discuss the need for planning to combat relapse.  Participation Level:  Did Not Attend  Participation Quality:  Drowsy  Affect:  Blunted and Depressed  Cognitive:  Alert, Appropriate and Oriented  Insight:  None  Engagement in Group:  None  Additional Comments:  Pt did not attend morning goals group. MHT made pt aware of group time, but pt refused.  Orma Render 03/14/2012, 12:15 PM

## 2012-03-15 DIAGNOSIS — F1320 Sedative, hypnotic or anxiolytic dependence, uncomplicated: Secondary | ICD-10-CM

## 2012-03-15 MED ORDER — TRAZODONE HCL 100 MG PO TABS
200.0000 mg | ORAL_TABLET | Freq: Every day | ORAL | Status: DC
  Filled 2012-03-15: qty 2

## 2012-03-15 MED ORDER — TRAZODONE HCL 150 MG PO TABS
250.0000 mg | ORAL_TABLET | Freq: Every day | ORAL | Status: DC
  Administered 2012-03-15 – 2012-03-16 (×2): 250 mg via ORAL
  Filled 2012-03-15: qty 42
  Filled 2012-03-15 (×3): qty 1

## 2012-03-15 NOTE — Progress Notes (Signed)
Psychoeducational Group Note  Date:  03/14/2012 Time:  2000  Group Topic/Focus:  AA  Participation Level:  Active  Participation Quality:  Appropriate  Affect:  Appropriate  Cognitive:  Alert  Insight:  Good  Engagement in Group:  Good  Additional Comments:    Zahrah Sutherlin R 03/15/2012, 12:03 AM

## 2012-03-15 NOTE — Progress Notes (Signed)
Patient ID: Anthony Haney, male   DOB: 06-26-1981, 31 y.o.   MRN: [id number] [facility] MD Progress Note  03/15/2012 11:54 AM   S: Patient seen and evaluated. Chart reviewed. Patient stated that his mood is ok. Reports poor sleep.   O: He denied any current thoughts of self injurious behavior, suicidal ideation or homicidal ideation. He denied any significant depressive signs or symptoms at this time. There were no auditory or visual hallucinations, paranoia, delusional thought processes, or mania noted. Thought process was linear and goal directed. No psychomotor agitation or retardation was noted. His speech was normal rate, tone and volume. Eye contact was good. Judgment and insight are fair. Patient has been up and engaged on the unit. No acute safety concerns reported from team.   Sleep:  Number of Hours: 6    Vital Signs:Blood pressure 121/77, pulse 112, temperature 97.6 F (36.4 C), temperature source Oral, resp. rate 20, height 5\' 6"  (1.676 m), weight 53.978 kg (119 lb).  Lab Results: No results found for this or any previous visit (from the past 48 hour(s)).  Physical Findings: AIMS: Facial and Oral Movements Muscles of Facial Expression: None, normal Lips and Perioral Area: None, normal Jaw: None, normal Tongue: None, normal,Extremity Movements Upper (arms, wrists, hands, fingers): None, normal Lower (legs, knees, ankles, toes): None, normal, Trunk Movements Neck, shoulders, hips: None, normal, Overall Severity Severity of abnormal movements (highest score from questions above): None, normal Incapacitation due to abnormal movements: None, normal Patient's awareness of abnormal movements (rate only patient's report): No Awareness, Dental Status Current problems with teeth and/or dentures?: No Does patient usually wear dentures?: No  CIWA:  CIWA-Ar Total: 1  COWS:  COWS Total Score: 3   CLINICAL FACTORS: Benzodiazepine Use & W/D Disorders; HepC; Herpes; Insomnia  Plan:  Will  decrease trazodone to 200 to help sleep better tonight Wonda Cerise 03/15/2012, 11:54 AM

## 2012-03-15 NOTE — Progress Notes (Signed)
[facility] Group Notes:  (Counselor/Nursing/MHT/Case Management/Adjunct)  03/15/2012 5:03 PM  Type of Therapy:  Group Therapy  Participation Level:  Active  Participation Quality:  Attentive, Intrusive, Redirectable, Sharing and Supportive  Affect:  Anxious and Depressed  Cognitive:  Alert, Appropriate and Oriented  Insight:  Good  Engagement in Group:  Good  Engagement in Therapy:  Good  Modes of Intervention:  Education, Problem-solving, Support and coping skill development and self sabatoging behaviors.   Summary of Progress/Problems:  Pt engaged in counseling group appropriately. Pt stated that he is currently a 3.5 today and is doing better than when initially came into the hospital. Pt stated that he is feeling anxious about going to treatment and getting there successfully. Pt feels this is his last chance to "get it right". Pt is experiencing anxiety due to his detoxing off Zanax. Pt stated being concerned that he isn't sleeping and that he isn't going to get on the right medication before he is discharged. Pt encouraged to talk to the doctor about his medication and symptoms he is having. Pt was able to engage in the discussion about self-sabatoging behaviors and identified the ones that he engages in and was also able to identify how to successfully stop this behavior. Pt able to identify witting poetry and staying the current moment as positive coping skills that he can engage in to help him be more successful in recovery this time. Pt agreed to work on expressing himself and communicating more effectively when upset about something.  Berlin Hun, MSW, LCSW 03/15/2012, 5:03 PM

## 2012-03-15 NOTE — Progress Notes (Signed)
D.  Pt pleasant on approach, concerned about what medications he will be able to have upon discharge, as well as catching the bus to get to [facility] on Monday.  Pt positive for evening AA group, denies SI/HI/hallucinations at this time.  Interacting appropriately within milieu.  A.  Support and encouragement offered, encouraged him to speak with doctor and treatment team about any concerns regarding medication management tomorrow.  R.  Pt stated verbal understanding.  Will continue to monitor.

## 2012-03-15 NOTE — Progress Notes (Signed)
Patient ID: Anthony Haney, male   DOB: August 08, 1980, 31 y.o.   MRN: [id number]   D: Patient lying in bed with eyes closed. Appears to be sleeping. Respirations even and non-labored. A: Staff will monitor on q 15 minute checks and follow treatment and medication orders as prescribed. R: No interaction with patient at this time.

## 2012-03-15 NOTE — Progress Notes (Signed)
D-Patient is in dayroom interacting with peers and attending groups with good participation. A-  Continues to be focused on d/c plans and medications for anxiety. Asks same questions over and over even with redirection.  Rates depression and hopelessness at 5. Denies SI.  R- Continue to educate on early recovery physical symptoms and emotional feelings. Continue current POC and evaluation of treatment goals.  Cont 15' checks for safety.

## 2012-03-15 NOTE — Progress Notes (Signed)
Patient did attend this evenings speaker AA meeting. 

## 2012-03-15 NOTE — Progress Notes (Signed)
[facility] Group Notes:  (Counselor/Nursing/MHT/Case Management/Adjunct)  03/15/2012 5:22 PM  Type of Therapy:  Psychoeducational Skills  Participation Level:  Active  Participation Quality:  Appropriate, Attentive and Supportive  Affect:  Appropriate  Cognitive:  Appropriate  Insight:  Good  Engagement in Group:  Good  Engagement in Therapy:  Good  Modes of Intervention:  Activity, Education, Socialization and Support  Summary of Progress/Problems:Group played Electrical engineer, discussing the benefits of coping skills and how they are used outside of the treatment facility.     Dalia Heading 03/15/2012, 5:22 PM

## 2012-03-15 NOTE — H&P (Signed)
Read and reviewed. See SRA upon admission completed by this Clinical research associate.

## 2012-03-16 DIAGNOSIS — F1910 Other psychoactive substance abuse, uncomplicated: Secondary | ICD-10-CM

## 2012-03-16 DIAGNOSIS — F1994 Other psychoactive substance use, unspecified with psychoactive substance-induced mood disorder: Secondary | ICD-10-CM

## 2012-03-16 MED ORDER — VALACYCLOVIR HCL 500 MG PO TABS: 500.0000 mg | ORAL_TABLET | Freq: Every day | ORAL | Status: DC

## 2012-03-16 MED ORDER — TRAZODONE HCL 50 MG PO TABS: 250.0000 mg | ORAL_TABLET | Freq: Every day | ORAL | Status: DC

## 2012-03-16 NOTE — Progress Notes (Signed)
Anthony Haney spoke with Clinical research associate this afternoon, stated he is worried that his girlfriend is going to leave him because she has not called him lately. Anthony Haney was encouraged to use his coping skills, stated he likes to write poetry when he is upset, patient was provided a journal and began writing.

## 2012-03-16 NOTE — Progress Notes (Signed)
Patient ID: Anthony Haney, male   DOB: [birth year], 31 y.o.   MRN: [id number]   [facility] Group Notes:  (Counselor/Nursing/MHT/Case Management/Adjunct)  03/16/2012 1:15 PM  Type of Therapy:  Group Therapy, Dance/Movement Therapy   Participation Level:  Active  Participation Quality:  Appropriate  Affect:  Appropriate  Cognitive:  Appropriate  Insight:  Good  Engagement in Group:  Good  Engagement in Therapy:  Good  Modes of Intervention:  Clarification, Problem-solving, Role-play, Socialization and Support  Summary of Progress/Problems: Therapist began group by inviting group members to share where they saw themselves in 90 days. Therapist and group members discussed the difference between dependence and abuse and types of support systems we need for recovery. Pt was active and attentive during group and tried to remain optimistic about treatment.     Cassidi Long 03/16/2012. 2:49 PM

## 2012-03-16 NOTE — Progress Notes (Addendum)
D- Patient is out in milieu interacting with peers and attending groups. A- Continues to focus on details of d/c.  C/O anxiety and back pain. PRN medications requested and received.  Rates depression and hopelessness at 3. Denies SI.  Good group participation. R- Support and reassurance given.  Continue current POC and evaluation of goals. Continue 15' checks for safety.

## 2012-03-16 NOTE — Progress Notes (Signed)
Patient ID: Anthony Haney, male   DOB: 1981-03-31, 31 y.o.   MRN: [id number]  Pt. attended and participated in aftercare planning group. Pt. verbally accepted information on suicide prevention, warning signs to look for with suicide and crisis line numbers to use. The pt. agreed to call crisis line numbers if having warning signs or having thoughts of suicide. Pt. listed their current anxiety level as 7.5 and their current depression level as 4. Pt shared that he is anxious about learning to cope with anxiety without Xanax. Pt plans to be D/C Monday morning to [facility]

## 2012-03-16 NOTE — Progress Notes (Signed)
[facility] Group Notes:  (Counselor/Nursing/MHT/Case Management/Adjunct)  03/16/2012 7:28 PM  Type of Therapy:  Psychoeducational Skills  Participation Level:  Minimal  Participation Quality:  Inattentive and Resistant  Affect:  Irritable  Cognitive:  Appropriate and Oriented  Insight:  None  Engagement in Group:  None  Engagement in Therapy:  n/a  Modes of Intervention:  Education and Problem-solving  Summary of Progress/Problems: [name] attended [facility] orientation group. Greer Pickerel stated [facility] groups and activities are "stupid" and "repaptetive" and that he has "already heard everything before". Greer Pickerel spoke when peers and Clinical research associate were speaking, and engaged is side talk during entire group.   Wandra Scot 03/16/2012, 7:28 PM

## 2012-03-16 NOTE — [facility] Suicide Risk Assessment (Signed)
Suicide Risk Assessment  Discharge Assessment     Demographic factors:  Male;Caucasian;Living alone;Unemployed    Current Mental Status Per Nursing Assessment::   On Admission:   no si At Discharge:   no si Current Mental Status Per Physician:  Objective: Appearance: Casual   Eye Contact:: Fair   Speech: Clear and Coherent   Volume: Normal   Mood: ok  Affect: Congruent   Thought Process: Coherent   Orientation: Full   Thought Content: WDL   Suicidal Thoughts: No   Homicidal Thoughts: No   Memory: Immediate; Fair   Judgement: limited  Insight: poor  Psychomotor Activity: Normal   Concentration: Fair   Recall: Fair   Akathisia: No     Loss Factors: Decrease in vocational status;Legal issues;Financial problems / change in socioeconomic status  Historical Factors:  no SI attemtps  Risk Reduction Factors:    wants to get better  Continued Clinical Symptoms:  Alcohol/Substance Abuse/Dependencies  Discharge Diagnoses:   AXIS I:  Substance Induced Mood Disorder, substance abuse (huffing air duster)  AXIS II:  Deferred AXIS [place]:   Past Medical History  Diagnosis Date  . Drug abuse   . Hepatitis C   . Herpes    AXIS IV:  other psychosocial or environmental problems AXIS V:  51-60 moderate symptoms  Cognitive Features That Contribute To Risk:  Closed-mindedness    Suicide Risk:  Minimal: No identifiable suicidal ideation.  Patients presenting with no risk factors but with morbid ruminations; may be classified as minimal risk based on the severity of the depressive symptoms  Plan Of Care/Follow-up recommendations:    rehab at [facility]. Plans to leave tomorrow AM   Wonda Cerise 03/16/2012, 3:55 PM

## 2012-03-16 NOTE — Progress Notes (Signed)
D.  Pt pleasant and bright on approach, feels ready for AM discharge.  Denies SI/HI/hallucinations.  No complaints voiced.  Positive for evening AA group. A.  Support and encouragement offered, will continue to monitor.  R.  Pt currently in group, no distress noted.

## 2012-03-17 MED ORDER — TRAZODONE HCL 100 MG PO TABS
250.0000 mg | ORAL_TABLET | Freq: Every day | ORAL | Status: DC
  Filled 2012-03-17: qty 2.5

## 2012-03-17 NOTE — Progress Notes (Signed)
Patient did attend this evenings speaker AA meeting. Pt was attentive and engaged.

## 2012-03-17 NOTE — Progress Notes (Addendum)
Pt discharged at 0600 as ordered with all paperwork completed.  Pt belongings returned from locker 45, no documentation from admission of belongings.  Pt states that he is missing a pair of prescription eye glasses.  Pt specifically remembers being asked if he wanted to take his glasses onto the unit and saying no.  Pt had a cell phone (iphone 5 with a crack in the screen), charger, wallet, medications, DL, and numerous bank cards in locker as well.  Pt given phone number for unit so that he may check back on his glasses.  Pt given his medication samples, discharge instructions, and bus pass.  Pt denies SI/HI/hallucinations.  Pt waiting in lobby for security to take him to bus station.   Pt is to be at [facility] by 0800.  Pt states that his bus is leaving at 0630.   Pt left via security for bus station [phone]

## 2012-03-19 NOTE — Progress Notes (Signed)
Patient Discharge Instructions:  After Visit Summary (AVS):   Faxed to:  03/19/2012 Psychiatric Admission Assessment Note:   Faxed to:  03/19/2012 Suicide Risk Assessment - Discharge Assessment:   Faxed to:  03/19/2012 Faxed/Sent to the Next Level Care provider:  03/19/2012  Faxed to [facility] @ [phone]  Wandra Scot, 03/19/2012, 5:32 PM

## 2012-03-31 NOTE — Discharge Summary (Signed)
Physician Discharge Summary Note  Patient:  Anthony Haney is an 31 y.o., male MRN:  [id number] DOB:  Dec 08, 1980 Patient phone:  [phone] (home)  Patient address:   [address] [place] [address]   Date of Admission:  03/13/2012 Date of Discharge: 03/17/2012  Discharge Diagnoses: Principal Problem:  *Benzodiazepine dependence  Axis Diagnosis:  Discharge Diagnoses:  AXIS I: Substance Induced Mood Disorder, substance abuse (huffing air duster)  AXIS II: Deferred  AXIS [place]:  Past Medical History   Diagnosis  Date   .  Drug abuse    .  Hepatitis C    .  Herpes    AXIS IV: other psychosocial or environmental problems  AXIS V: 51-60 moderate symptoms  Level of Care:  [facility]  Hospital Course:       Anthony Haney was admitted for detox from cocaine, opiates, benzodiazepines, THC and crisis management.  Anthony Haney was treated with the standard Clonidine protocol.  Medical problems were identified and treated. Home medication was restarted as appropriate.     Improvement was monitored by COWS scores and patient's daily report of withdrawal symptom reduction. Emotional and mental status was monitored by daily self inventory reports completed by the patient and clinical staff.  The patient's participation in unit programming was good.   Safety, appropriate behavior, and positive interaction in the unit milieu was not a problem.      The patient was evaluated by the treatment team for stability and plans for continued recovery upon discharge. Anthony Haney was offered further treatment options upon discharge including Residential, IOP, and Outpatient treatment. The patient's motivation was an integral factor for scheduling further treatment.  Employment, transportation, bed availability, health status, family support, and any pending legal issues were also considered. Anthony Haney was referred to [facility] from [facility] and will be returning there.    Upon completion of detox the patient was both mentally and medically stable for  discharge.         They left [facility] with all personal belongings via bus transportation in no apparent distress.  Consults:  None  Significant Diagnostic Studies:  None  Discharge Vitals:   Blood pressure 104/82, pulse 104, temperature 96.8 F (36 C), temperature source Oral, resp. rate 16, height 5\' 6"  (1.676 m), weight 53.978 kg (119 lb)..  Mental Status Exam: See Mental Status Examination and Suicide Risk Assessment completed by Attending Physician prior to discharge. Discharge destination:  [facility]  Is patient on multiple antipsychotic therapies at discharge:  No  Has Patient had three or more failed trials of antipsychotic monotherapy by history: N/A Recommended Plan for Multiple Antipsychotic Therapies: N/A Discharge Orders    Future Orders Please Complete By Expires   Diet - low sodium heart healthy      Increase activity slowly      Discharge instructions      Comments:   Take all of your medications as prescribed.  Be sure to keep ALL follow up appointments as scheduled. This is to ensure getting your refills on time to avoid any interruption in your medication.  If you find that you can not keep your appointment, call the clinic and reschedule. Be sure to tell the nurse if you will need a refill before your appointment.     Medication List  As of 03/31/2012  3:19 PM   STOP taking these medications         alprazolam 2 MG tablet      multivitamin with minerals Tabs  nabumetone 750 MG tablet         TAKE these medications      Indication    traZODone 50 MG tablet   Commonly known as: DESYREL   Take 5 tablets (250 mg total) by mouth at bedtime.    Indication: Trouble Sleeping      valACYclovir 500 MG tablet   Commonly known as: VALTREX   Take 1 tablet (500 mg total) by mouth daily. For HSV.    Indication: Genital Herpes           Follow-up Information    Follow up with [facility] on 03/24/2012. (Be there at 8AM sharp)    Contact information:    [address]  [phone]        Follow-up recommendations:   Activities: Resume typical activities Diet: Resume typical diet Tests: none Comments:   Take all your medications as prescribed by your mental healthcare provider. Report any adverse effects and or reactions from your medicines to your outpatient provider promptly. Patient is instructed and cautioned to not engage in alcohol and or illegal drug use while on prescription medicines. In the event of worsening symptoms, patient is instructed to call the crisis hotline, 911 and or go to the nearest ED for appropriate evaluation and treatment of symptoms.  Signed: Laddie Naeem 03/31/2012 3:14 PM

## 2012-04-02 NOTE — Discharge Summary (Signed)
Read and reviewed. 

## 2012-05-07 ENCOUNTER — Encounter (HOSPITAL_COMMUNITY): Payer: Self-pay | Admitting: Emergency Medicine

## 2012-05-07 ENCOUNTER — Emergency Department (HOSPITAL_COMMUNITY)
Admission: EM | Admit: 2012-05-07 | Discharge: 2012-05-07 | Disposition: A | Discharge status: Other Acute Inpatient Hospital | Payer: BC Managed Care – PPO | Attending: Emergency Medicine | Admitting: Emergency Medicine

## 2012-05-07 ENCOUNTER — Encounter (HOSPITAL_COMMUNITY): Payer: Self-pay | Admitting: *Deleted

## 2012-05-07 ENCOUNTER — Inpatient Hospital Stay (HOSPITAL_COMMUNITY)
Admission: AD | Admit: 2012-05-07 | Discharge: 2012-05-13 | DRG: 744 | Payer: BC Managed Care – PPO | Source: Ambulatory Visit | Attending: Psychiatry | Admitting: Psychiatry

## 2012-05-07 DIAGNOSIS — F1910 Other psychoactive substance abuse, uncomplicated: Secondary | ICD-10-CM

## 2012-05-07 DIAGNOSIS — F1320 Sedative, hypnotic or anxiolytic dependence, uncomplicated: Secondary | ICD-10-CM | POA: Insufficient documentation

## 2012-05-07 DIAGNOSIS — F19139 Other psychoactive substance abuse with withdrawal, unspecified: Secondary | ICD-10-CM | POA: Clinically undetermined

## 2012-05-07 DIAGNOSIS — B1920 Unspecified viral hepatitis C without hepatic coma: Secondary | ICD-10-CM | POA: Diagnosis present

## 2012-05-07 DIAGNOSIS — Z79899 Other long term (current) drug therapy: Secondary | ICD-10-CM | POA: Insufficient documentation

## 2012-05-07 DIAGNOSIS — Z5900 Homelessness unspecified: Secondary | ICD-10-CM | POA: Insufficient documentation

## 2012-05-07 DIAGNOSIS — R45851 Suicidal ideations: Secondary | ICD-10-CM | POA: Insufficient documentation

## 2012-05-07 DIAGNOSIS — F1920 Other psychoactive substance dependence, uncomplicated: Secondary | ICD-10-CM | POA: Diagnosis present

## 2012-05-07 DIAGNOSIS — R1084 Generalized abdominal pain: Secondary | ICD-10-CM | POA: Insufficient documentation

## 2012-05-07 DIAGNOSIS — F19939 Other psychoactive substance use, unspecified with withdrawal, unspecified: Principal | ICD-10-CM | POA: Diagnosis present

## 2012-05-07 DIAGNOSIS — F22 Delusional disorders: Secondary | ICD-10-CM | POA: Insufficient documentation

## 2012-05-07 DIAGNOSIS — F1994 Other psychoactive substance use, unspecified with psychoactive substance-induced mood disorder: Secondary | ICD-10-CM | POA: Diagnosis present

## 2012-05-07 DIAGNOSIS — B009 Herpesviral infection, unspecified: Secondary | ICD-10-CM | POA: Diagnosis present

## 2012-05-07 DIAGNOSIS — R000 Tachycardia, unspecified: Secondary | ICD-10-CM | POA: Insufficient documentation

## 2012-05-07 LAB — CBC WITH DIFFERENTIAL/PLATELET
Basophils Relative: 0 % (ref 0–1)
Eosinophils Absolute: 0.1 10*3/uL (ref 0.0–0.7)
Eosinophils Relative: 1 % (ref 0–5)
Hemoglobin: 15 g/dL (ref 13.0–17.0)
Lymphs Abs: 2.2 10*3/uL (ref 0.7–4.0)
MCH: 33.9 pg (ref 26.0–34.0)
MCHC: 35.6 g/dL (ref 30.0–36.0)
MCV: 95 fL (ref 78.0–100.0)
Monocytes Relative: 7 % (ref 3–12)
Neutrophils Relative %: 70 % (ref 43–77)
Platelets: 183 10*3/uL (ref 150–400)
RBC: 4.43 MIL/uL (ref 4.22–5.81)

## 2012-05-07 LAB — HEPATIC FUNCTION PANEL
AST: 48 U/L — ABNORMAL HIGH (ref 0–37)
Albumin: 3.9 g/dL (ref 3.5–5.2)
Total Protein: 7.1 g/dL (ref 6.0–8.3)

## 2012-05-07 LAB — BASIC METABOLIC PANEL
BUN: 25 mg/dL — ABNORMAL HIGH (ref 6–23)
Calcium: 9.8 mg/dL (ref 8.4–10.5)
Creatinine, Ser: 1.06 mg/dL (ref 0.50–1.35)
GFR calc Af Amer: >90 mL/min (ref >90)
GFR calc non Af Amer: >90 mL/min (ref >90)
Glucose, Bld: 98 mg/dL (ref 70–99)
Potassium: 4 mEq/L (ref 3.5–5.1)

## 2012-05-07 LAB — SALICYLATE LEVEL: Salicylate Lvl: <2 mg/dL — ABNORMAL LOW (ref 2.8–20.0)

## 2012-05-07 LAB — LIPASE, BLOOD: Lipase: 45 U/L (ref 11–59)

## 2012-05-07 LAB — ACETAMINOPHEN LEVEL: Acetaminophen (Tylenol), Serum: <15 ug/mL (ref 10–30)

## 2012-05-07 LAB — ETHANOL: Alcohol, Ethyl (B): <11 mg/dL (ref 0–11)

## 2012-05-07 LAB — RAPID URINE DRUG SCREEN, HOSP PERFORMED: Barbiturates: NOT DETECTED

## 2012-05-07 MED ORDER — CLONIDINE HCL 0.1 MG PO TABS: 0.1000 mg | ORAL_TABLET | ORAL | Status: DC

## 2012-05-07 MED ORDER — HYDROXYZINE HCL 25 MG PO TABS: 25.0000 mg | ORAL_TABLET | Freq: Four times a day (QID) | ORAL | Status: DC | PRN

## 2012-05-07 MED ORDER — LOPERAMIDE HCL 2 MG PO CAPS: 2.0000 mg | ORAL_CAPSULE | ORAL | Status: DC | PRN

## 2012-05-07 MED ORDER — ACETAMINOPHEN 325 MG PO TABS: 650.0000 mg | ORAL_TABLET | Freq: Once | ORAL | Status: DC

## 2012-05-07 MED ORDER — METHOCARBAMOL 500 MG PO TABS
500.0000 mg | ORAL_TABLET | Freq: Three times a day (TID) | ORAL | Status: AC | PRN
  Administered 2012-05-07 – 2012-05-09 (×4): 500 mg via ORAL
  Filled 2012-05-07 (×4): qty 1

## 2012-05-07 MED ORDER — METHOCARBAMOL 500 MG PO TABS: 500.0000 mg | ORAL_TABLET | Freq: Three times a day (TID) | ORAL | Status: DC | PRN

## 2012-05-07 MED ORDER — ONDANSETRON 4 MG PO TBDP: 4.0000 mg | ORAL_TABLET | Freq: Four times a day (QID) | ORAL | Status: DC | PRN

## 2012-05-07 MED ORDER — LORAZEPAM 1 MG PO TABS: 1.0000 mg | ORAL_TABLET | Freq: Four times a day (QID) | ORAL | Status: DC | PRN

## 2012-05-07 MED ORDER — CLONIDINE HCL 0.1 MG PO TABS
0.1000 mg | ORAL_TABLET | Freq: Four times a day (QID) | ORAL | Status: DC
  Administered 2012-05-07 (×2): 0.1 mg via ORAL
  Filled 2012-05-07 (×2): qty 1

## 2012-05-07 MED ORDER — NAPROXEN 500 MG PO TABS: 500.0000 mg | ORAL_TABLET | Freq: Two times a day (BID) | ORAL | Status: AC | PRN

## 2012-05-07 MED ORDER — NAPROXEN 250 MG PO TABS: 500.0000 mg | ORAL_TABLET | Freq: Two times a day (BID) | ORAL | Status: DC | PRN

## 2012-05-07 MED ORDER — SODIUM CHLORIDE 0.9 % IV BOLUS (SEPSIS)
1000.0000 mL | Freq: Once | INTRAVENOUS | Status: AC
  Administered 2012-05-07: 1000 mL via INTRAVENOUS

## 2012-05-07 MED ORDER — ONDANSETRON 4 MG PO TBDP: 4.0000 mg | ORAL_TABLET | Freq: Four times a day (QID) | ORAL | Status: AC | PRN

## 2012-05-07 MED ORDER — THIAMINE HCL 100 MG/ML IJ SOLN: 100.0000 mg | Freq: Once | INTRAMUSCULAR | Status: DC

## 2012-05-07 MED ORDER — CHLORDIAZEPOXIDE HCL 25 MG PO CAPS
25.0000 mg | ORAL_CAPSULE | Freq: Four times a day (QID) | ORAL | Status: AC | PRN
  Administered 2012-05-08 – 2012-05-10 (×4): 25 mg via ORAL
  Filled 2012-05-07 (×4): qty 1

## 2012-05-07 MED ORDER — CHLORDIAZEPOXIDE HCL 25 MG PO CAPS
50.0000 mg | ORAL_CAPSULE | Freq: Once | ORAL | Status: AC
  Administered 2012-05-07: 50 mg via ORAL
  Filled 2012-05-07: qty 2

## 2012-05-07 MED ORDER — CLONIDINE HCL 0.1 MG PO TABS: 0.1000 mg | ORAL_TABLET | Freq: Every day | ORAL | Status: DC

## 2012-05-07 MED ORDER — ALUM & MAG HYDROXIDE-SIMETH 200-200-20 MG/5ML PO SUSP: 30.0000 mL | ORAL | Status: DC | PRN

## 2012-05-07 MED ORDER — DICYCLOMINE HCL 20 MG PO TABS
20.0000 mg | ORAL_TABLET | Freq: Four times a day (QID) | ORAL | Status: DC | PRN
  Filled 2012-05-07: qty 1

## 2012-05-07 MED ORDER — ACETAMINOPHEN 325 MG PO TABS: 650.0000 mg | ORAL_TABLET | Freq: Four times a day (QID) | ORAL | Status: DC | PRN

## 2012-05-07 MED ORDER — LOPERAMIDE HCL 2 MG PO CAPS: 2.0000 mg | ORAL_CAPSULE | ORAL | Status: AC | PRN

## 2012-05-07 MED ORDER — ADULT MULTIVITAMIN W/MINERALS CH
1.0000 | ORAL_TABLET | Freq: Every day | ORAL | Status: DC
Start: 2012-05-08 — End: 2012-05-13
  Administered 2012-05-08 – 2012-05-09 (×2): 1 via ORAL
  Filled 2012-05-07 (×7): qty 1

## 2012-05-07 MED ORDER — DICYCLOMINE HCL 20 MG PO TABS
20.0000 mg | ORAL_TABLET | Freq: Four times a day (QID) | ORAL | Status: AC | PRN
  Administered 2012-05-08: 20 mg via ORAL
  Filled 2012-05-07: qty 1

## 2012-05-07 MED ORDER — HYDROXYZINE HCL 25 MG PO TABS
25.0000 mg | ORAL_TABLET | Freq: Four times a day (QID) | ORAL | Status: AC | PRN
  Administered 2012-05-08 – 2012-05-10 (×3): 25 mg via ORAL
  Filled 2012-05-07: qty 1

## 2012-05-07 MED ORDER — VALACYCLOVIR HCL 500 MG PO TABS
500.0000 mg | ORAL_TABLET | Freq: Every day | ORAL | Status: DC
  Administered 2012-05-07: 500 mg via ORAL
  Filled 2012-05-07 (×2): qty 1

## 2012-05-07 MED ORDER — TRAZODONE HCL 50 MG PO TABS
300.0000 mg | ORAL_TABLET | Freq: Every day | ORAL | Status: DC
  Administered 2012-05-07: 300 mg via ORAL
  Filled 2012-05-07: qty 6

## 2012-05-07 MED ORDER — VITAMIN B-1 100 MG PO TABS
100.0000 mg | ORAL_TABLET | Freq: Every day | ORAL | Status: DC
  Administered 2012-05-08 – 2012-05-09 (×2): 100 mg via ORAL
  Filled 2012-05-07 (×7): qty 1

## 2012-05-07 MED ORDER — MAGNESIUM HYDROXIDE 400 MG/5ML PO SUSP: 30.0000 mL | Freq: Every day | ORAL | Status: DC | PRN

## 2012-05-07 NOTE — ED Provider Notes (Signed)
Patient accepted at [facility] by Dr. Lucianne Muss.  BP 119/64  Pulse 103  Temp 97.1 F (36.2 C) (Oral)  Resp 20  SpO2 97%   Glynn Octave, MD 05/07/12 2042

## 2012-05-07 NOTE — BH Assessment (Signed)
Assessment Note   Anthony Haney is an 31 y.o. male that presented on his own requesting treatment for multiple substance use and also admits suicidal ideation with plan. Pt currently lives and alone.  Pt apparently was planning to begin classes at [facility] when he expressed plan and intent to harm self and was sent to ED.  Pt admits abusing multiple substances, including Air Can Duster (2-3 daily-last use yesterday), Heroin-4o dollar bag daily-last use 4 days ago), Cocaine- up to a gram weekly (last use 4 days ago) and Xanax- 6 mg daily- (last use three days ago) and Cannibus 1/4 bag weekly (last use about 10 days ago).  Pt also continues to endorse SI w/plan to overdose.  Pt is tearful and crying uncontrollably, voices  Fear and paranoia, and states "I am hearing and seeing and tasting things that aren't there.  Help me."  Pt scored an 18 on his CIWA and a 12 on his COWS.  Pt is not able to contract for safety and is in need of inpatient care.  Pt is currently being treated for his active withdrawals.    Axis I: Substance Induced Mood Disorder Axis II: Deferred Axis [name]:  Past Medical History  Diagnosis Date  . Drug abuse   . Hepatitis C   . Herpes    Axis IV: other psychosocial or environmental problems, problems related to legal system/crime, problems related to social environment and problems with primary support group Axis V: 21-30 behavior considerably influenced by delusions or hallucinations OR serious impairment in judgment, communication OR inability to function in almost all areas  Past Medical History:  Past Medical History  Diagnosis Date  . Drug abuse   . Hepatitis C   . Herpes     Past Surgical History  Procedure Date  . Hand surgery     Left    Family History: No family history on file.  Social History:  reports that he has been smoking Cigarettes.  He has a 10 pack-year smoking history. He does not have any smokeless tobacco history on file. He reports that he  drinks alcohol. He reports that he uses illicit drugs (IV, Benzodiazepines, Cocaine, and Marijuana).  Additional Social History:  Alcohol / Drug Use Pain Medications: See MAR Prescriptions: See MAR Over the Counter: See MAR History of alcohol / drug use?: Yes Substance #1 Name of Substance 1: Air Can Duster 1 - Age of First Use: 30 1 - Amount (size/oz): up to 3 cans 1 - Frequency: Daily 1 - Duration: 3 months 1 - Last Use / Amount: yesterday- 10/08 Substance #2 Name of Substance 2: Xanax 2 - Age of First Use: 27 2 - Amount (size/oz): 6 mg 2 - Frequency: QD 2 - Duration: 3 years 2 - Last Use / Amount: 4 days ago- 10/05 Substance #3 Name of Substance 3: Heroin 3 - Age of First Use: 29 3 - Amount (size/oz): 40 dollar bag 3 - Frequency: daily 3 - Duration: years 3 - Last Use / Amount: 4 days ago- 10/05 Substance #4 Name of Substance 4: Cocaine 4 - Age of First Use: 16 4 - Amount (size/oz): up to 1 gram 4 - Frequency: weekly 4 - Duration: 10 years 4 - Last Use / Amount: 4 days ago- 10/05  CIWA: CIWA-Ar BP: 119/64 mmHg Pulse Rate: 103  Nausea and Vomiting: mild nausea with no vomiting Tactile Disturbances: very mild itching, pins and needles, burning or numbness Tremor: no tremor Auditory Disturbances: moderate harshness  or ability to frighten Paroxysmal Sweats: two Visual Disturbances: mild sensitivity Anxiety: moderately anxious, or guarded, so anxiety is inferred Headache, Fullness in Head: very mild Agitation: normal activity Orientation and Clouding of Sensorium: disoriented for data by no more than 2 calendar days CIWA-Ar Total: 16  COWS: Clinical Opiate Withdrawal Scale (COWS) Resting Pulse Rate: Pulse Rate 101-120 Sweating: Flushed or Observable moistness on face Restlessness: Able to sit still Pupil Size: Pupils pinned or normal size for room light Bone or Joint Aches: Mild diffuse discomfort Runny Nose or Tearing: Not present GI Upset: nausea or loose  stool Tremor: No tremor Yawning: No yawning Anxiety or Irritability: Patient obviously irritable/anxious Gooseflesh Skin: Piloerection of skin can be felt or hairs standing up on arms COWS Total Score: 12   Allergies: No Known Allergies  Home Medications:  (Not in a hospital admission)  OB/GYN Status:  No LMP for male patient.  General Assessment Data Living Arrangements: Alone Can pt return to current living arrangement?: Yes Admission Status: Voluntary Is patient capable of signing voluntary admission?: Yes Transfer from: Acute Hospital Referral Source: Self/Family/Friend  Education Status Is patient currently in school?: No Highest grade of school patient has completed:  (Masters in Sports Management)  Risk to self Suicidal Ideation: Yes-Currently Present Suicidal Intent: Yes-Currently Present Is patient at risk for suicide?: Yes Suicidal Plan?: Yes-Currently Present Specify Current Suicidal Plan: to overdose Access to Means: No What has been your use of drugs/alcohol within the last 12 months?: ongoing use of mulpiple substances Previous Attempts/Gestures: No How many times?: 0  Other Self Harm Risks: impulsive, non-compliant Triggers for Past Attempts: None known Intentional Self Injurious Behavior: Damaging Comment - Self Injurious Behavior: ongoing use of drugs Family Suicide History: No Recent stressful life event(s): Conflict (Comment);Turmoil (Comment) Persecutory voices/beliefs?: Yes Depression: Yes Depression Symptoms: Despondent;Tearfulness;Fatigue;Guilt;Loss of interest in usual pleasures;Feeling worthless/self pity;Feeling angry/irritable Substance abuse history and/or treatment for substance abuse?: Yes Suicide prevention information given to non-admitted patients: Not applicable  Risk to Others Homicidal Ideation: No Thoughts of Harm to Others: No Current Homicidal Intent: No Current Homicidal Plan: No Access to Homicidal Means: No Identified  Victim: none per pt History of harm to others?: No Assessment of Violence: None Noted Violent Behavior Description: pt is not expressing thoughts Does patient have access to weapons?: No Criminal Charges Pending?: Yes Describe Pending Criminal Charges: Leaving the scene of an accident Does patient have a court date: Yes Court Date:  (unknown)  Psychosis Hallucinations: Auditory;Tactile;Visual Delusions: None noted  Mental Status Report Appear/Hygiene: Disheveled Eye Contact: Poor Motor Activity: Unremarkable Speech: Rapid Level of Consciousness: Restless;Irritable Mood: Anxious;Depressed;Suspicious;Apathetic;Despair;Fearful;Guilty;Helpless;Irritable;Preoccupied;Sad;Sullen Affect: Anxious;Apathetic;Apprehensive;Blunted;Depressed;Fearful;Frightened;Irritable;Inconsistent with thought content;Preoccupied;Sullen Anxiety Level: Severe Panic attack frequency: unknown Most recent panic attack: unknown Thought Processes: Irrelevant;Flight of Ideas Judgement: Impaired Orientation: Place;Time;Person;Situation Obsessive Compulsive Thoughts/Behaviors: Moderate  Cognitive Functioning Concentration: Decreased Memory: Recent Impaired;Remote Impaired IQ: Above Average Insight: Poor Impulse Control: Poor Appetite: Good Weight Loss: 0  Weight Gain: 0  Sleep: Decreased Total Hours of Sleep:  (disturbed and broken; only 2-3 hrs at a time) Vegetative Symptoms: Staying in bed;Decreased grooming  ADLScreening [facility]) Patient's cognitive ability adequate to safely complete daily activities?: Yes Patient able to express need for assistance with ADLs?: Yes Independently performs ADLs?: Yes (appropriate for developmental age)  Abuse/Neglect [facility]) Physical Abuse: Denies Verbal Abuse: Denies Sexual Abuse: Denies  Prior Inpatient Therapy Prior Inpatient Therapy: Yes Prior Therapy Dates: 2010 Prior Therapy Facilty/Provider([facility]): [facility] Reason for Treatment: Substance Abuse-Cocaine  Prior  Outpatient Therapy Prior Outpatient Therapy: Yes Prior Therapy Dates: 2010 Prior Therapy Facilty/Provider(s): [facility] Reason for Treatment: SA  ADL Screening (condition at time of admission) Patient's cognitive ability adequate to safely complete daily activities?: Yes Patient able to express need for assistance with ADLs?: Yes Independently performs ADLs?: Yes (appropriate for developmental age)       Abuse/Neglect Assessment (Assessment to be complete while patient is alone) Physical Abuse: Denies Verbal Abuse: Denies Sexual Abuse: Denies Exploitation of patient/patient's resources: Denies Self-Neglect: Denies Values / Beliefs Cultural Requests During Hospitalization: None Spiritual Requests During Hospitalization: None   Advance Directives (For Healthcare) Advance Directive: Patient does not have advance directive    Additional Information 1:1 In Past 12 Months?: No CIRT Risk: No Elopement Risk: No Does patient have medical clearance?: Yes     Disposition:  Please run for possibility of inpatient treatment.   Disposition Disposition of Patient: Inpatient treatment program Type of inpatient treatment program: Adult  On Site Evaluation by:   Reviewed with Physician:     Angelica Ran 05/07/2012 6:37 PM

## 2012-05-07 NOTE — BH Assessment (Signed)
[facility] Assessment Progress Note      Pt accepted to [facility] by Donell Sievert to Dr Lucianne Muss on the [address] hall.  Support paperwork completed, ED staff notified.

## 2012-05-07 NOTE — ED Provider Notes (Signed)
History     CSN: [id number] Arrival date & time 05/07/12  4098 First MD Initiated Contact with Patient 05/07/12 1104     JX:[id number] ideation  The history is provided by the patient.  Pt has been withdrawing from heroin xanax and cocaine.  He also has been inhaling air can duster.  Pt states he is suicidal and paranoid and needs help.  Pt has been using drugs for 10 years.  He would like help to get off of these.  He has had thoughts of suicide but no plan.  He would rather be dead than live like this.  Pt is homeless and does not have a job.  HA also started after not taking xanax.  He also feels confused and cannot keep track of his thoughts. Past Medical History  Diagnosis Date  . Drug abuse   . Hepatitis C   . Herpes     Past Surgical History  Procedure Date  . Hand surgery     Left    No family history on file.  History  Substance Use Topics  . Smoking status: Current Every Day Smoker -- 1.0 packs/day for 10 years    Types: Cigarettes  . Smokeless tobacco: Not on file  . Alcohol Use: Yes     2 per month      Review of Systems  Constitutional: Negative for fever.  HENT: Negative for neck pain.   Respiratory: Negative for cough.   Cardiovascular: Negative for chest pain.  Gastrointestinal: Negative for abdominal pain.  Skin: Negative for rash.  Neurological: Positive for headaches (no injury). Negative for seizures and weakness.    Allergies  Review of patient's allergies indicates no known allergies.  Home Medications   Current Outpatient Rx  Name Route Sig Dispense Refill  . HYDROXYZINE PAMOATE 50 MG PO CAPS Oral Take 50 mg by mouth every 4 (four) hours as needed. anxiety    . TRAZODONE HCL 150 MG PO TABS Oral Take 300 mg by mouth at bedtime. Directions: take 2-3 tablets at bedtime    . VALACYCLOVIR HCL 500 MG PO TABS Oral Take 1 tablet (500 mg total) by mouth daily. For HSV. 30 tablet 0  . ALPRAZOLAM 2 MG PO TABS Oral Take 2 mg by mouth 3 (three) times  daily.      BP 128/76  Pulse 128  Temp 97.9 F (36.6 C)  Resp 20  SpO2 99%  Physical Exam  Nursing note and vitals reviewed. Constitutional: He appears well-developed and well-nourished. No distress.  HENT:  Head: Normocephalic and atraumatic.  Right Ear: External ear normal.  Left Ear: External ear normal.  Eyes: Conjunctivae normal are normal. Right eye exhibits no discharge. Left eye exhibits no discharge. No scleral icterus.  Neck: Neck supple. No tracheal deviation present.  Cardiovascular: Normal rate, regular rhythm and intact distal pulses.   Pulmonary/Chest: Effort normal and breath sounds normal. No stridor. No respiratory distress. He has no wheezes. He has no rales.  Abdominal: Soft. Bowel sounds are normal. He exhibits no distension. There is generalized tenderness (mild). There is no rebound and no guarding.  Musculoskeletal: He exhibits no edema and no tenderness.  Neurological: He is alert. He has normal strength. No sensory deficit. Cranial nerve deficit:  no gross defecits noted. He exhibits normal muscle tone. He displays no seizure activity. Coordination normal.  Skin: Skin is warm and dry. No rash noted.  Psychiatric: He has a normal mood and affect.    ED Course  Procedures (including critical care time)  Labs Reviewed  BASIC METABOLIC PANEL - Abnormal; Notable for the following:    BUN 25 (*)     All other components within normal limits  SALICYLATE LEVEL - Abnormal; Notable for the following:    Salicylate Lvl <2.0 (*)     All other components within normal limits  HEPATIC FUNCTION PANEL - Abnormal; Notable for the following:    AST 48 (*)     ALT 100 (*)     Total Bilirubin 0.2 (*)     All other components within normal limits  CBC WITH DIFFERENTIAL  URINE RAPID DRUG SCREEN (HOSP PERFORMED)  ETHANOL  ACETAMINOPHEN LEVEL  LIPASE, BLOOD   No results found.     MDM  Pt appears medically stable although initial heart rate tachycardic.   Repeat vitals show improvement in rate.  Will give one liter fluid bolus.  Will move to pod c for further psych assessment.         Celene Kras, MD 05/07/12 [phone]

## 2012-05-07 NOTE — Progress Notes (Signed)
Patient ID: Zoltan Prateek Knipple, male   DOB: 12-24-1980, 31 y.o.   MRN: [id number]  Pt was agitated and anxious during the adm process. Continued to stand over tech as she went thru his property and attempt to reach into the bag to remove items. Pt had to be continuously redirected. Continued to ask MHT to refold his clothes, "God what do you think is going to fall out?"   Pt stated that he went to [facility] today and was informed to "get checked out first before going into rehab." Pt stated, "I dont know whats going on. Everything seems to be fake. My reality is almost up". Pt stated that he had been in jail 18 days fir trespassing and discharged apprx 1 week ago. Pt states his drug use has led to his suicidal thoughts.  Report from ED stated pt told several different reasons surrounding his adm. 1) Admitted to [facility] and use of cocaine and herion. 2) Released from prison with use of cocaine, heroin, and THC. 3) He does "nothing", but complained of a stuffy noise.  Pt has genital herpes, and hep C.  Pt given ice cream and milk as requested.

## 2012-05-07 NOTE — ED Notes (Signed)
States that he wants to hurt himself states w/draing from herion and coke has a plan just released fro prison

## 2012-05-07 NOTE — Tx Team (Signed)
Initial Interdisciplinary Treatment Plan  PATIENT STRENGTHS: (choose at least two) Ability for insight Active sense of humor Average or above average intelligence Capable of independent living Communication skills General fund of knowledge Physical Health Supportive family/friends  PATIENT STRESSORS: Financial difficulties Legal issue Medication change or noncompliance Substance abuse Traumatic event   PROBLEM LIST: Problem List/Patient Goals Date to be addressed Date deferred Reason deferred Estimated date of resolution  "anxiety" 05/07/12     "Mental health, my stability"  05/07/12           anxiety 05/07/12     Substance abuse 05/07/12     Suicidal Ideation 05/07/12                        DISCHARGE CRITERIA:  Ability to meet basic life and health needs Adequate post-discharge living arrangements Improved stabilization in mood, thinking, and/or behavior Medical problems require only outpatient monitoring Motivation to continue treatment in a less acute level of care Need for constant or close observation no longer present Reduction of life-threatening or endangering symptoms to within safe limits Safe-care adequate arrangements made Verbal commitment to aftercare and medication compliance Withdrawal symptoms are absent or subacute and managed without 24-hour nursing intervention  PRELIMINARY DISCHARGE PLAN: Attend 12-step recovery group Placement in alternative living arrangements  PATIENT/FAMIILY INVOLVEMENT: This treatment plan has been presented to and reviewed with the patient, Anthony Haney, and/or family member.  The patient and family have been given the opportunity to ask questions and make suggestions.  Anthony Haney 05/07/2012, 11:09 PM

## 2012-05-07 NOTE — ED Notes (Signed)
Security called for wanding pt getting changed into scrubs

## 2012-05-07 NOTE — ED Notes (Signed)
Pt states he wants to die bc he is withdrawaling from xanax for 4 days. Was taking 6mg /day for 4 yrs. Pt states he takes heroin, cocain, marijuana, air can duster.  Last used [employer] yesterday and it has been several days for anyother drugs due to not having money.  Pt treated here for previous detox.  Pt admits attempting to OD on heroin 1 month ago but woke up after passing out.  Pt believes he is having visual and auditory hal but not sure what is real and what is not.

## 2012-05-07 NOTE — ED Notes (Signed)
Patient has been very defensive/sarcastic with staff and denied any drug use when psych assessment was performed. ACT Irving Burton) states when she talked with patient he did not know if the hospital was real or if he was really here and he kept asking her if she was real. Patient stated to Irving Burton that he huffs multiple air can dusters per day and uses cocaine several times per day x 10 years. Patient stated that his father pays for him an apartment and pays for health insurance for him.

## 2012-05-08 DIAGNOSIS — F1994 Other psychoactive substance use, unspecified with psychoactive substance-induced mood disorder: Secondary | ICD-10-CM

## 2012-05-08 DIAGNOSIS — F1910 Other psychoactive substance abuse, uncomplicated: Secondary | ICD-10-CM

## 2012-05-08 MED ORDER — NICOTINE 21 MG/24HR TD PT24
21.0000 mg | MEDICATED_PATCH | Freq: Every day | TRANSDERMAL | Status: DC
  Administered 2012-05-08 – 2012-05-09 (×2): 21 mg via TRANSDERMAL
  Filled 2012-05-08 (×7): qty 1

## 2012-05-08 NOTE — Progress Notes (Signed)
Psychoeducational Group Note  Date:  05/08/2012 Time:  0930  Group Topic/Focus:  Self Esteem Action Plan:   The focus of this group is to help patients create a plan to continue to build self-esteem after discharge.  Participation Level: Did Not Attend  Participation Quality:  Not Applicable  Affect:  Not Applicable  Cognitive:  Not Applicable  Insight:  Not Applicable  Engagement in Group: Not Applicable  Additional Comments:  Pt could not attend group this morning due to the fact he was not feeling well.  Tahjae Durr E 05/08/2012, 1:50 PM

## 2012-05-08 NOTE — [facility] Suicide Risk Assessment (Signed)
Suicide Risk Assessment  Admission Assessment     Nursing information obtained from:  Patient Demographic factors:  Male;Adolescent or young adult;Caucasian;Low socioeconomic status;Living alone;Unemployed Current Mental Status:  Suicidal ideation indicated by patient Loss Factors:  Legal issues;Financial problems / change in socioeconomic status Historical Factors:  Prior suicide attempts;Impulsivity Risk Reduction Factors:  Sense of responsibility to family;Positive social support  CLINICAL FACTORS:   Alcohol/Substance Abuse/Dependencies Unstable or Poor Therapeutic Relationship Previous Psychiatric Diagnoses and Treatments  COGNITIVE FEATURES THAT CONTRIBUTE TO RISK:  Polarized thinking    SUICIDE RISK:   Moderate:  Frequent suicidal ideation with limited intensity, and duration, some specificity in terms of plans, no associated intent, good self-control, limited dysphoria/symptomatology, some risk factors present, and identifiable protective factors, including available and accessible social support.  PLAN OF CARE: Patient placed on detox protocol and would like to continue on Librium versus Ativan as patient is hepatitis C positive Patient to be transferred to [facility] once stable Will continue to educate patient in regards to his polysubstance abuse, the risks and need to follow through with outpatient treatment   Anthony Haney 05/08/2012, 1:51 PM

## 2012-05-08 NOTE — Discharge Planning (Signed)
Spoke with Anthony Haney at [facility].  Anthony Haney is to show up there at 8 AM on Tues., Oct 15 for screening for possible admission.

## 2012-05-08 NOTE — H&P (Addendum)
Psychiatric Admission Assessment Adult  Patient Identification:  Anthony Haney Date of Evaluation:  05/08/2012 Chief Complaint:  Substance Induced Mood Disorder History of Present Illness:: Anthony Haney is an 30 year transferred from [facility] ED for inpatient treatment of psychosis and detox secondary to polysubstance abuse.  Patient presented on his own requesting treatment for multiple substance use and also admitted to suicidal ideation with plan. Patient apparently was planning to begin classes at [facility] when he expressed plan and intent to harm self and was sent to ED. Patient admitted abusing multiple substances, which include Psychologist, prison and probation services (2-3 daily-last use yesterday), Heroin-4o dollar bag daily-last use 4 days ago), Cocaine- up to a gram weekly (last use 4 days ago) and Xanax- 6 mg daily- (last use three days ago) and Cannibus 1/4 bag weekly (last use about 10 days ago). Patient also stated that he planned to overdose.  Pt currently lives alone.  patient in the ER also added that he was fearful, paranoid and stated "I am hearing and seeing and tasting things that aren't there. Help me."  Patient is currently being treated for his active withdrawals.   Mood Symptoms:  Sadness, Depression Symptoms:  depressed mood, suicidal thoughts with specific plan, anxiety, (Hypo) Manic Symptoms:  Irritable Mood, Anxiety Symptoms:  Excessive Worry, Psychotic Symptoms:  Hallucinations: Auditory Olfactory Tactile Visual  PTSD Symptoms: Had a traumatic exposure:  None reported  Past Psychiatric History: Diagnosis: Substance induced mood disorder   Hospitalizations: Was recently discharged in August of this year from [facility] inpatient. Patient at that time was admitted for detox .  Outpatient Care: Patient is to followup with [facility] in [place] , has seen Dr. Tiajuana Amass in the past   Substance Abuse Care: Patient says that he plans to begin classes at [facility] on discharge     Self-Mutilation: None per patient   Suicidal Attempts: Patient is a history of suicidal thoughts with plans but no attempt   Violent Behaviors: None per patient    Past Medical History:   Past Medical History  Diagnosis Date  . Drug abuse   . Hepatitis C   . Herpes    None. Allergies:  No Known Allergies PTA Medications: Prescriptions prior to admission  Medication Sig Dispense Refill  . hydrOXYzine (VISTARIL) 50 MG capsule Take 50 mg by mouth every 4 (four) hours as needed. anxiety      . traZODone (DESYREL) 150 MG tablet Take 300 mg by mouth at bedtime. Directions: take 2-3 tablets at bedtime      . valACYclovir (VALTREX) 500 MG tablet Take 1 tablet (500 mg total) by mouth daily. For HSV.  30 tablet  0  . alprazolam (XANAX) 2 MG tablet Take 2 mg by mouth 3 (three) times daily.        Previous Psychotropic Medications: Patient says that he does not remember what medications he's been on in the past   Substance Abuse History in the last 12 months: Patient is history of cannabis, and Xanax, cocaine and heroine use  Consequences of Substance Abuse: Medical Consequences:  Hepatitis C  Social History: Current Place of Residence:  Lives in [place] : Marital Status:  Single  Relationships: Education:  Automotive engineer   Family History:  History reviewed. No pertinent family history.  Mental Status Examination/Evaluation: Objective:  Appearance: Disheveled  Eye Contact::  Minimal  Speech:  Clear and Coherent  Volume:  Decreased  Mood:  Anxious and Irritable  Affect:  Labile and  Tearful  Thought Process:  Coherent  Orientation:  Full  Thought Content:  Hallucinations: Auditory Gustatory Olfactory Tactile and Paranoid Ideation  Suicidal Thoughts:  Yes.  with intent/plan  Homicidal Thoughts:  No  Memory:  Immediate;   Poor Recent;   Poor Remote;   Poor  Judgement:  Impaired  Insight:  Lacking  Psychomotor Activity:  Mannerisms and Restlessness  Concentration:  Poor   Recall:  Poor  Akathisia:  No  Handed:  Right  AIMS (if indicated):     Assets:  Desire for Improvement Physical Health  Sleep:  Number of Hours: 5.75     Laboratory/X-Ray Psychological Evaluation(s)      Assessment:    AXIS I:  Substance Induced Mood Disorder AXIS II:  Deferred AXIS [place]:   Past Medical History  Diagnosis Date  . Drug abuse   . Hepatitis C   . Herpes    AXIS IV:  problems related to social environment and problems with primary support group AXIS V:  31-40 impairment in reality testing  Treatment Plan/Recommendations: Daily contact with patient Medication management Treatment Plan Summary: 1. Admit for crisis management and stabilization.  2. Detox with standard (Librium/Clonidine) protocol.  3. Treat health problems as indicated.  4. Develop treatment plan to decrease risk of relapse upon discharge and the need for readmission.  5. Psycho-social education regarding relapse prevention and self care.  6. Health care follow up as needed for medical problems.   Current Medications:  Current Facility-Administered Medications  Medication Dose Route Frequency Provider Last Rate Last Dose  . acetaminophen (TYLENOL) tablet 650 mg  650 mg Oral Q6H PRN Kerry Hough, PA      . alum & mag hydroxide-simeth (MAALOX/MYLANTA) 200-200-20 MG/5ML suspension 30 mL  30 mL Oral Q4H PRN Kerry Hough, PA      . chlordiazePOXIDE (LIBRIUM) capsule 25 mg  25 mg Oral Q6H PRN Kerry Hough, PA   25 mg at 05/08/12 1008  . chlordiazePOXIDE (LIBRIUM) capsule 50 mg  50 mg Oral Once Kerry Hough, PA   50 mg at 05/07/12 2354  . dicyclomine (BENTYL) tablet 20 mg  20 mg Oral Q6H PRN Kerry Hough, PA      . hydrOXYzine (ATARAX/VISTARIL) tablet 25 mg  25 mg Oral Q6H PRN Kerry Hough, PA      . loperamide (IMODIUM) capsule 2-4 mg  2-4 mg Oral PRN Kerry Hough, PA      . magnesium hydroxide (MILK OF MAGNESIA) suspension 30 mL  30 mL Oral Daily PRN Kerry Hough, PA       . methocarbamol (ROBAXIN) tablet 500 mg  500 mg Oral Q8H PRN Kerry Hough, PA   500 mg at 05/08/12 1020  . multivitamin with minerals tablet 1 tablet  1 tablet Oral Daily Kerry Hough, PA   1 tablet at 05/08/12 0825  . naproxen (NAPROSYN) tablet 500 mg  500 mg Oral BID PRN Kerry Hough, PA      . nicotine (NICODERM CQ - dosed in mg/24 hours) patch 21 mg  21 mg Transdermal Daily Nelly Rout, MD   21 mg at 05/08/12 1009  . ondansetron (ZOFRAN-ODT) disintegrating tablet 4 mg  4 mg Oral Q6H PRN Kerry Hough, PA      . thiamine (B-1) injection 100 mg  100 mg Intramuscular Once Kerry Hough, PA      . thiamine (VITAMIN B-1) tablet 100 mg  100 mg Oral  Daily Kerry Hough, PA   100 mg at 05/08/12 0825  . DISCONTD: hydrOXYzine (ATARAX/VISTARIL) tablet 25 mg  25 mg Oral Q6H PRN Kerry Hough, PA      . DISCONTD: loperamide (IMODIUM) capsule 2-4 mg  2-4 mg Oral PRN Kerry Hough, PA      . DISCONTD: ondansetron (ZOFRAN-ODT) disintegrating tablet 4 mg  4 mg Oral Q6H PRN Kerry Hough, PA       Facility-Administered Medications Ordered in Other Encounters  Medication Dose Route Frequency Provider Last Rate Last Dose  . DISCONTD: cloNIDine (CATAPRES) tablet 0.1 mg  0.1 mg Oral QID Celene Kras, MD   0.1 mg at 05/07/12 1743  . DISCONTD: cloNIDine (CATAPRES) tablet 0.1 mg  0.1 mg Oral BH-qamhs Celene Kras, MD      . DISCONTD: cloNIDine (CATAPRES) tablet 0.1 mg  0.1 mg Oral QAC breakfast Celene Kras, MD      . DISCONTD: dicyclomine (BENTYL) tablet 20 mg  20 mg Oral Q6H PRN Celene Kras, MD      . DISCONTD: hydrOXYzine (ATARAX/VISTARIL) tablet 25 mg  25 mg Oral Q6H PRN Celene Kras, MD      . DISCONTD: loperamide (IMODIUM) capsule 2-4 mg  2-4 mg Oral PRN Celene Kras, MD      . DISCONTD: LORazepam (ATIVAN) tablet 1 mg  1 mg Oral Q6H PRN Celene Kras, MD      . DISCONTD: methocarbamol (ROBAXIN) tablet 500 mg  500 mg Oral Q8H PRN Celene Kras, MD      . DISCONTD: naproxen (NAPROSYN) tablet 500  mg  500 mg Oral BID PRN Celene Kras, MD      . DISCONTD: ondansetron (ZOFRAN-ODT) disintegrating tablet 4 mg  4 mg Oral Q6H PRN Celene Kras, MD      . DISCONTD: traZODone (DESYREL) tablet 300 mg  300 mg Oral QHS Celene Kras, MD   300 mg at 05/07/12 2158  . DISCONTD: valACYclovir (VALTREX) tablet 500 mg  500 mg Oral Daily Celene Kras, MD   500 mg at 05/07/12 1356    Observation Level/Precautions: Level [place] precautions   Laboratory:  Labs to be reviewed  Psychotherapy: Patient to start tomorrow participating in the [address] programming.   Medications: Patient currently on Librium detox protocol  and has when necessary medications ordered for nausea, sleep and abdominal pain  Routine PRN Medications:  Yes,   Consultations:  None at this time   Discharge Concerns: Patient has been referred to [facility] in the past but has relapsed and would benefit from continued education in regards to his substance use  Patient had a physical examination done in the ED which showed mild abdominal tenderness with no distention, normal bowel sounds. Agree with the ED findings    Elridge Stemm 10/10/201312:52 PM

## 2012-05-08 NOTE — Progress Notes (Signed)
D   Pt has been pleasant and cooperative  He said he wanted to be discharged  He said he lied to get into the hospital   He said he just needed to get a little more sleep but really did not feel the need to be hospitalized   He has been appropriate and having very minimal signs and symptoms of withdrawal   A   Verbal support given  Medications administered and effectiveness monitored   Q 15 min checks R   Pt safe at present time

## 2012-05-08 NOTE — Treatment Plan (Signed)
Interdisciplinary Treatment Plan Update (Adult)  Date: 05/08/2012  Time Reviewed: 9:23 AM   Progress in Treatment: Attending groups: No Participating in groups: No Taking medication as prescribed: Yes Tolerating medication: Yes   Family/Significant other contact made:  Not yet Patient understands diagnosis:  Yes  As evidenced by asking for help with on-going substance abuse/detox Discussing patient identified problems/goals with staff:  Yes  See below Medical problems stabilized or resolved:  Yes Denies suicidal/homicidal ideation: Yes  In tx team Issues/concerns per patient self-inventory:  Not filled out Other:  New problem(s) identified: N/A  Reason for Continuation of Hospitalization: Medication stabilization Withdrawal symptoms  Interventions implemented related to continuation of hospitalization: Librium, clonodine tapers  Medication management for mood  Encourage group attendance and participation  Additional comments:  Estimated length of stay: 3-4 days  Discharge Plan: unknown-[name] is feeling too ill this AM to talk about the next step  New goal(s): N/A  Review of initial/current patient goals per problem list:   1.  Goal(s): Eliminate SI  Met:  Yes  Target date:10/10  As evidenced by: self report  2.  Goal (s): Safely detox from benzos, opiates  Met:  No  Target date:10/14  As evidenced HY:QMVHQI vitals, no withdrawal symptoms  3.  Goal(s): Identify comprehensive sobriety plan  Met:  No  Target date:10/14  As evidenced by: self report  4.  Goal(s):  Met:  No  Target date:  As evidenced by:  Attendees: Patient:    Family:     Physician: Nelly Rout 05/08/2012 9:23 AM   Nursing: Novella Rob  05/08/2012 9:23 AM   Case Manager:  Richelle Ito,  05/08/2012 9:23 AM   Counselor:  Veto Kemps 05/08/2012 9:23 AM   Other:     Other:     Other:     Other:      Scribe for Treatment Team:   Ida Rogue, 05/08/2012 9:23 AM

## 2012-05-08 NOTE — Progress Notes (Signed)
D: Patient is sleeping in bed on approach appears, appears sad  and isolative. Cooperative with assessment. Denies SI/HI/AV and contract for safety at this time. Pt B/P was 92/54, PR 92. Offered no additional question or concerns  A: Safety has been maintained with Q15 minutes observation. Patient was offered ginger ale and milk. Pt. encourage to drink more fluids. Pt was advice to rise slowly from bed and seek help when needed. Supported and encouragement provided.  R: Patient remains safe. He is complaint with medications but refused to attend groups. Pt able to go to lunch. Safety has been maintained Q15 and continue current POC.

## 2012-05-09 DIAGNOSIS — F1920 Other psychoactive substance dependence, uncomplicated: Secondary | ICD-10-CM

## 2012-05-09 MED ORDER — VALACYCLOVIR HCL 500 MG PO TABS
500.0000 mg | ORAL_TABLET | Freq: Every day | ORAL | Status: DC
  Filled 2012-05-09 (×5): qty 1

## 2012-05-09 NOTE — Progress Notes (Signed)
[facility] Group Notes:  (Counselor/Nursing/MHT/Case Management/Adjunct)  05/09/2012 1:56 PM  Type of Therapy:  Group Therapy  Participation Level:  Did Not Attend   Anthony Haney 05/09/2012, 1:56 PM

## 2012-05-09 NOTE — Tx Team (Signed)
Interdisciplinary Treatment Plan Update (Adult)  Date: 05/09/2012  Time Reviewed: 1000  Progress in Treatment: Attending groups: No Participating in groups:  No Taking medication as prescribed: Yes Tolerating medication:  Yes Family/Significant othe contact made:  Counselor exploring appropriate collateral source. Patient understands diagnosis:  Yes Discussing patient identified problems/goals with staff:  No Medical problems stabilized or resolved:  Yes Denies suicidal/homicidal ideation: Yes Issues/concerns per patient self-inventory:  None identified Other: N/A  New problem(s) identified: None Identified  Reason for Continuation of Hospitalization: Anxiety Depression   Interventions implemented related to continuation of hospitalization: mood stabilization, medication monitoring and adjustment, group therapy and psycho education, safety checks q 15 mins  Additional comments: N/A  Estimated length of stay: 2-3 days  Discharge Plan: Patient has intake assessment at [facility] on 05/13/12. Confirmed by case Production designer, theatre/television/film.  New goal(s): N/A  Review of initial/current patient goals per problem list:   1.  Goal(s): Reduce depressive symptoms  Met:  No, patient remains depressed, isolating and in his bed refusing to attend treatment team.  Target date: by discharge  As evidenced by: Reducing depression from a 10 to a 3 as reported by pt.   2.  Goal (s): Eliminate Suicidal Ideation  Met:  Yes  Target date: by discharge  As evidenced by: Eliminate suicidal ideation.   3.  Goal(s): Reduce Withdrawal Symtpoms  Met:  No  Target date: by discharge  As evidenced by: Patient will report no withdrawal symptoms.     Attendees: Patient:  Refused to attend.   Family:     Physician:  Geryl Rankins, MD 05/09/2012 1000  Nursing:      Case Manager:  Barrie Folk RN MS EdS 05/09/2012 1000  Counselor:  Veto Kemps, MT-BC 05/09/2012 1000  Other:  Herbert Seta Smart LCSW  Intern 05/09/2012 1000  Other:  Shelda Jakes RN 05/09/2012 1000  Other:  Brendia Sacks 05/09/2012 1000  Other:      Scribe for Treatment Team:   Kc Sedlak 05/09/2012 1000

## 2012-05-09 NOTE — Progress Notes (Signed)
Psychoeducational Group Note  Date:  05/09/2012 Time:  2000  Group Topic/Focus:  Karaoke night   Participation Level:  Did not attend   Participation Quality:  Did not attend   Affect:  Did not attend   Cognitive:  Did not attend   Insight:  Did not attend   Engagement in Group:  Did not attend   Additional Comments:  Pt did not attend karaoke this evening.    Garris Melhorn A 05/09/2012, 2:54 AM

## 2012-05-09 NOTE — Discharge Planning (Signed)
Pt did not attend morning aftercare planning group. He stated that he was told d/c date was Sunday and needs to verify this in order to arrange ride. Pt said that "family friend" will be picking him up and he is planning to go to [facility] on Tuesday at 8am.

## 2012-05-09 NOTE — Progress Notes (Signed)
[facility] MD Progress Note  05/09/2012 5:10 PM Subjective: The patient is a 31 y/o male with a past psychiatric history of Polysubstance Dependence. The patient reports that he had been in jail for 30 days, and tried to enroll in a 30 day treatment program. He reports that he has had one previous suicide attempt and is ambiguous when discussing suicidal thoughts. He refused to come for groups or treatment team, so he was seen at the bedside.  He states he is on Valtrex and would like to restart the same. He reports his mood is "pissed of."  He states that he receives his treatment ar [facility] and would like to restart the same.   Diagnosis:   Axis I: Polysubstance dependence Axis II: Cluster B Traits Axis Lula:  Past Medical History  Diagnosis Date  . Drug abuse   . Hepatitis C   . Herpes    Axis IV: other psychosocial or environmental problems Axis V: 11-20 some danger of hurting self or others possible OR occasionally fails to maintain minimal personal hygiene OR gross impairment in communication  ADL's:  Intact  Sleep: Poor  Appetite:  Poor  Suicidal Ideation:  Plan:  Patient denies-but is reliable. Intent:  Patient denies Means:  Patient denies Homicidal Ideation:  Plan:  Patient denies Intent:  Patient denies Means:  Patient denies  AEB (as evidenced by): Patient's past behavior.  Mental Status Examination/Evaluation: Objective:  Appearance: Disheveled and lying in bed refusing to get up.  Eye Contact::  Poor  Speech:  Slow  Volume:  Decreased  Mood:  Depressed and Irritable  Affect:  Appropriate and Depressed  Thought Process:  Goal Directed and Logical  Orientation:  Full  Thought Content:  WDL  Suicidal Thoughts:  No  Homicidal Thoughts:  No  Memory:  Patient would not cooperate.  Judgement:  Poor  Insight:  Lacking  Psychomotor Activity:  Decreased  Concentration:  Poor  Recall: Patient would not cooperate.  Akathisia:  No  Handed:  Right  AIMS (if indicated):    Not indicated  Assets:  Financial Resources/Insurance  Sleep:  Number of Hours: 3.5    Vital Signs:Blood pressure 117/75, pulse 110, temperature 98.2 F (36.8 C), temperature source Oral, resp. rate 20, height 5\' 7"  (1.702 m), weight 61.689 kg (136 lb). Current Medications: Current Facility-Administered Medications  Medication Dose Route Frequency Provider Last Rate Last Dose  . acetaminophen (TYLENOL) tablet 650 mg  650 mg Oral Q6H PRN Kerry Hough, PA      . alum & mag hydroxide-simeth (MAALOX/MYLANTA) 200-200-20 MG/5ML suspension 30 mL  30 mL Oral Q4H PRN Kerry Hough, PA      . chlordiazePOXIDE (LIBRIUM) capsule 25 mg  25 mg Oral Q6H PRN Kerry Hough, PA   25 mg at 05/08/12 1733  . dicyclomine (BENTYL) tablet 20 mg  20 mg Oral Q6H PRN Kerry Hough, PA   20 mg at 05/08/12 1302  . hydrOXYzine (ATARAX/VISTARIL) tablet 25 mg  25 mg Oral Q6H PRN Kerry Hough, PA   25 mg at 05/08/12 2134  . loperamide (IMODIUM) capsule 2-4 mg  2-4 mg Oral PRN Kerry Hough, PA      . magnesium hydroxide (MILK OF MAGNESIA) suspension 30 mL  30 mL Oral Daily PRN Kerry Hough, PA      . methocarbamol (ROBAXIN) tablet 500 mg  500 mg Oral Q8H PRN Kerry Hough, PA   500 mg at 05/09/12 0835  .  multivitamin with minerals tablet 1 tablet  1 tablet Oral Daily Kerry Hough, PA   1 tablet at 05/09/12 0809  . naproxen (NAPROSYN) tablet 500 mg  500 mg Oral BID PRN Kerry Hough, PA      . nicotine (NICODERM CQ - dosed in mg/24 hours) patch 21 mg  21 mg Transdermal Daily Nelly Rout, MD   21 mg at 05/09/12 0814  . ondansetron (ZOFRAN-ODT) disintegrating tablet 4 mg  4 mg Oral Q6H PRN Kerry Hough, PA      . thiamine (B-1) injection 100 mg  100 mg Intramuscular Once Kerry Hough, PA      . thiamine (VITAMIN B-1) tablet 100 mg  100 mg Oral Daily Kerry Hough, PA   100 mg at 05/09/12 0809    Lab Results: No results found for this or any previous visit (from the past 48  hour(s)).  Physical Findings: AIMS: Facial and Oral Movements Muscles of Facial Expression: None, normal Lips and Perioral Area: None, normal Jaw: None, normal Tongue: None, normal,Extremity Movements Upper (arms, wrists, hands, fingers): None, normal Lower (legs, knees, ankles, toes): None, normal, Trunk Movements Neck, shoulders, hips: None, normal, Overall Severity Severity of abnormal movements (highest score from questions above): None, normal Incapacitation due to abnormal movements: None, normal Patient's awareness of abnormal movements (rate only patient's report): No Awareness, Dental Status Current problems with teeth and/or dentures?: No  CIWA:  CIWA-Ar Total: 3  COWS:     Treatment Plan Summary: Daily contact with patient to assess and evaluate symptoms and progress in treatment Medication management Patient will participate in groups.  Plan: 1. Will continue librium detox protocol with CIWA. 2. Will add trazodone 50 mg QHS for sleep. 3. Will continue PRN medications. 4. Patient will attend groups.  5. Will continue to monitor patient.      Nazire Fruth 05/09/2012, 5:10 PM

## 2012-05-09 NOTE — Progress Notes (Signed)
Adult Psychosocial Assessment Update Interdisciplinary Team  [facility] admissions[facility]discharges:  Admissions Discharges  Date: 03/13/12 Date: 03/17/12  Date: Date:  Date: Date:  Date: Date:  Date: Date:   Changes since the last Psychosocial Assessment (including adherence to outpatient mental health and/or substance abuse treatment, situational issues contributing to decompensation and/or relapse). Suicidal, using multiple substances-Air Can Duster, heroin, cocaine, Xanax, and THC He was supposed to follow up with [facility] but they would not take him due to having insurance.             Discharge Plan 1. Will you be returning to the same living situation after discharge?   Yes:x No:      If no, what is your plan?           2. Would you like a referral for services when you are discharged? Yes: x    If yes, for what services?  No:       SA       Summary and Recommendations (to be completed by the evaluator) Patient is a 31 year old white male with diagnosis of Polysubstance Abuse. Patient was admitted due to being suicidal with a plan. He is experiencing increased anxiety and paranoia. He is using multiple substance including duster, heroin, Xanax, THC and cocaine. Patient would benefit from crisis stabilization, medication evaluation, group therapy and psycho-education groups to work on coping skills, case management for referrals to SA treatment.                       Signature:  Veto Kemps, 05/09/2012 8:55 AM

## 2012-05-09 NOTE — Progress Notes (Signed)
[facility] Group Notes:  (Counselor/Nursing/MHT/Case Management/Adjunct)  05/09/2012 11:33 AM  Type of Therapy:  Group Therapy  05/08/12  Participation Level:  Did Not Attend    Veto Kemps 05/09/2012, 11:33 AM

## 2012-05-09 NOTE — Progress Notes (Signed)
Spoke with Richelle Ito at [facility] to confirm intake assessment on 05/13/12. Patient is indeed scheduled for an assessment. Joice Lofts RN MS EdS 05/09/2012  1:59 PM

## 2012-05-09 NOTE — Progress Notes (Signed)
D: Patient appears sad and isolative. Calm and cooperative with assessment.  Pt wanted to be left alone and did not participate in treatment team. Pt continuously ask when he can be discharged because he "needs to know to arrange for transportation". Denies SI/HI/AV and contract for safety at this time. Offered no additional question or concerns  A: Safety has been maintained with Q15 minutes observation.  Patient advised to attend treatment team and groups. Supported and encouragement provided.  R: Patient remains safe. He is complaint with medication. Safety has been maintained Q15 and continue current POC.

## 2012-05-09 NOTE — Progress Notes (Signed)
Patient would not get out of bed for treatment team.

## 2012-05-09 NOTE — Progress Notes (Signed)
Psychoeducational Group Note  Date:  05/09/2012 Time:  2000  Group Topic/Focus:  Wrap-Up Group:   The focus of this group is to help patients review their daily goal of treatment and discuss progress on daily workbooks.  Participation Level:  Active  Participation Quality:  Appropriate  Affect:  Appropriate  Cognitive:  Appropriate  Insight:  Limited  Engagement in Group:  Good  Additional Comments:  Pt attended wrap up group and participated. Pt did not share much in group.   Montrae Braithwaite A 05/09/2012, 10:25 PM

## 2012-05-10 MED ORDER — TRAZODONE HCL 50 MG PO TABS
50.0000 mg | ORAL_TABLET | Freq: Every day | ORAL | Status: DC
  Administered 2012-05-10 – 2012-05-11 (×2): 50 mg via ORAL
  Filled 2012-05-10 (×6): qty 1

## 2012-05-10 MED ORDER — TRAZODONE HCL 50 MG PO TABS
50.0000 mg | ORAL_TABLET | Freq: Once | ORAL | Status: AC | PRN
  Administered 2012-05-10: 50 mg via ORAL
  Filled 2012-05-10: qty 1

## 2012-05-10 NOTE — Progress Notes (Signed)
Psychoeducational Group Note  Date:  05/10/2012 Time:  0945 am  Group Topic/Focus:  Identifying Needs:   The focus of this group is to help patients identify their personal needs that have been historically problematic and identify healthy behaviors to address their needs.  Participation Level:  Did Not Attend  Andrena Mews 05/10/2012,11:26 AM

## 2012-05-10 NOTE — Progress Notes (Signed)
Patient ID: Anthony Haney, male   DOB: 22-Jul-1981, 31 y.o.   MRN: [id number]   [facility] Group Notes:  (Counselor/Nursing/MHT/Case Management/Adjunct)  05/10/2012 11 AM  Type of Therapy:  Aftercare Planning, Group Therapy, Dance/Movement Therapy   Participation Level:  Did Not Attend   Cassidi Long 05/10/2012. 11:41 AM

## 2012-05-10 NOTE — Progress Notes (Signed)
D   Pt stayed in bed most of the morning and refused to come to the medication window for his morning meds   He appears anxious and dishelved   He denies signs and symptoms of withdrawal   He interacts minimally with others and isolates A   Verbal support given  Medication encouragement given and educated on theraputic use  Q 15 min checks R   Pt safe at present

## 2012-05-10 NOTE — Progress Notes (Signed)
Patient ID: Anthony Haney, male   DOB: 31-Jul-1980, 31 y.o.   MRN: [id number] D. The patient spent most of the evening in the dayroom watching TV. Very little interaction with others. Attended evening group with minimal participation. States he is being discharged this weekend and will f/u with [facility] for rehab. A. Met with patient 1:1. Identified lack of sleep as his major problem. Obtained an order for Trazodone for tonight. To be reviewed by M.D. in the morning. Discussed and administered medications. R. The patient was very grateful for the sleep medication. Denied any present or past suicidal ideation. States he wanted to come into the hospital to get some rest, so he lied about being suicidal.

## 2012-05-10 NOTE — Progress Notes (Signed)
  Anthony Haney is a 31 y.o. male [id number] 01-Mar-1981  05/07/2012 Active Problems:  Substance abuse withdrawal  Polysubstance abuse   Mental Status: Mood is cranky - his head hurts. Denies SI/HI/AVH.    Subjective/Objective: No apparent signs of withdrawal. Wants to go to [facility].   Filed Vitals:   05/10/12 0921  BP: 115/88  Pulse: 98  Temp:   Resp:     Lab Results:   BMET    Component Value Date/Time   NA 137 05/07/2012 1032   K 4.0 05/07/2012 1032   CL 100 05/07/2012 1032   CO2 25 05/07/2012 1032   GLUCOSE 98 05/07/2012 1032   BUN 25* 05/07/2012 1032   CREATININE 1.06 05/07/2012 1032   CALCIUM 9.8 05/07/2012 1032   GFRNONAA >90 05/07/2012 1032   GFRAA >90 05/07/2012 1032    Medications:  Scheduled:     . multivitamin with minerals  1 tablet Oral Daily  . nicotine  21 mg Transdermal Daily  . thiamine  100 mg Intramuscular Once  . thiamine  100 mg Oral Daily  . valACYclovir  500 mg Oral Daily     PRN Meds acetaminophen, alum & mag hydroxide-simeth, chlordiazePOXIDE, dicyclomine, hydrOXYzine, loperamide, magnesium hydroxide, methocarbamol, naproxen, ondansetron, traZODone  Plan: encourage to go to group no indication to change/adjust plan of care. [name],[name] 05/10/2012

## 2012-05-11 NOTE — Progress Notes (Signed)
Therapist met with pt and he said he had "no safe place to go tonight" and that he needed to take the bus or a cab to [employer]. Pt requested to stay at [facility] tonight. Pt agreed to resend his 72 hr request for D/C so that this would be possible. Dr Theotis Barrio was consulted and he agreed to cancel the D/C so that the pt could stay tonight with the plan to be D/C first thing in the morning. Pt has follow up set with [facility] residential on Tuesday 06/13/12. Pt nurse agreed to enter the cancel D/C order.

## 2012-05-11 NOTE — Progress Notes (Addendum)
Patient ID: Anthony Haney, male   DOB: 1980-10-08, 31 y.o.   MRN: [id number] D. The patient remains aloof and chooses not to engage in conversation with staff or others unless he has too. Attended evening group only because he did not want to loose his spot laying on the couch watching T.V. During evening wrap up group faced away from everyone and continued to draw on his paper, with an occasional sarcastic, rude remark mumbled under his breath after someone spoke. When it was his turn to speak, he did not make eye contact with anyone, as he continued to lay on the couch drawing and stated he wanted to know when he was being discharged and if we planned on providing him transportation home. A. Encouraged group participation. Provided with information and explanation regarding 72 hour release he signed. Encouraged to contact family or friends regarding transportation. He reports that this is not an option. Encouraged to speak with case manager. R. Not engaged in therapy or activities offered during this hospitalization.

## 2012-05-11 NOTE — Progress Notes (Signed)
Psychoeducational Group Note  Date:  05/11/2012 Time:  0945 am  Group Topic/Focus:  Making Healthy Choices:   The focus of this group is to help patients identify negative/unhealthy choices they were using prior to admission and identify positive/healthier coping strategies to replace them upon discharge.  Participation Level:  Did Not Attend    Jentzen Minasyan J 05/11/2012, 10:29 AM  

## 2012-05-11 NOTE — BHH Suicide Risk Assessment (Deleted)
Suicide Risk Assessment  Admission Assessment     Nursing information obtained from:  Patient Demographic factors:  Male;Adolescent or young adult;Caucasian;Low socioeconomic status;Living alone;Unemployed Current Mental Status:  No si, no hi, no avh, organised thought process Loss Factors:  Legal issues;Financial problems / change in socioeconomic status Historical Factors:  Prior suicide attempts;Impulsivity Risk Reduction Factors:  Sense of responsibility to family;Positive social support  CLINICAL FACTORS:   Alcohol/Substance Abuse/Dependencies  COGNITIVE FEATURES THAT CONTRIBUTE TO RISK:  Closed-mindedness    SUICIDE RISK:   Minimal: No identifiable suicidal ideation.  Patients presenting with no risk factors but with morbid ruminations; may be classified as minimal risk based on the severity of the depressive symptoms  PLAN OF CARE:  Will discharge AMA on pt request. Pt has [facility] information.  Wonda Cerise 05/11/2012, 12:13 PM

## 2012-05-11 NOTE — Progress Notes (Signed)
  Anthony Haney is a 31 y.o. male [id number] May 14, 1981  05/07/2012 Active Problems:  Substance abuse withdrawal  Polysubstance abuse   Mental Status: Mood is fine denies SI/HI/AVH.   Subjective/Objective: Requesting discharge as 72 hour request for discharge is complete. Still wants to go to [facility] but wants to go home and have his family bring to [facility]. Says his transportation to [facility] didn't work out last time from here.  No complaints of withdrawal. Patient is not participating in milleau in fact agitates the other patients.   Filed Vitals:   05/11/12 0701  BP: 105/66  Pulse: 73  Temp:   Resp:     Lab Results:   BMET    Component Value Date/Time   NA 137 05/07/2012 1032   K 4.0 05/07/2012 1032   CL 100 05/07/2012 1032   CO2 25 05/07/2012 1032   GLUCOSE 98 05/07/2012 1032   BUN 25* 05/07/2012 1032   CREATININE 1.06 05/07/2012 1032   CALCIUM 9.8 05/07/2012 1032   GFRNONAA >90 05/07/2012 1032   GFRAA >90 05/07/2012 1032    Medications:  Scheduled:     . multivitamin with minerals  1 tablet Oral Daily  . nicotine  21 mg Transdermal Daily  . thiamine  100 mg Intramuscular Once  . thiamine  100 mg Oral Daily  . traZODone  50 mg Oral QHS  . valACYclovir  500 mg Oral Daily     PRN Meds acetaminophen, alum & mag hydroxide-simeth, chlordiazePOXIDE, dicyclomine, hydrOXYzine, loperamide, magnesium hydroxide, methocarbamol, naproxen, ondansetron  Plan: Will alert Dr. Theotis Barrio that patient is requesting discharge.   Anthony Haney,Anthony D. 05/11/2012

## 2012-05-11 NOTE — B[facility] Suicide Risk Assessment (Signed)
Suicide Risk Assessment  Discharge Assessment     Demographic Factors:  Male  Mental Status Per Nursing Assessment::   On Admission:  Suicidal ideation indicated by patient  Current Mental Status by Physician: No si, no hi, no AVH, logical  Loss Factors: Financial problems/change in socioeconomic status  Historical Factors: Impulsivity  Risk Reduction Factors:   Living with another person, especially a relative  Continued Clinical Symptoms:  Alcohol/Substance Abuse/Dependencies  Cognitive Features That Contribute To Risk:  Closed-mindedness    Suicide Risk:  Minimal: No identifiable suicidal ideation.  Patients presenting with no risk factors but with morbid ruminations; may be classified as minimal risk based on the severity of the depressive symptoms  Discharge Diagnoses:   AXIS I:  Substance Induced Mood Disorder AXIS II:  Deferred AXIS Kasir:   Past Medical History  Diagnosis Date  . Drug abuse   . Hepatitis C   . Herpes    AXIS IV:  other psychosocial or environmental problems AXIS V:  61-70 mild symptoms  Plan Of Care/Follow-up recommendations:  Activity:  as tolerated  Wants to leave AMA. Has info about [facility]  Is patient on multiple antipsychotic therapies at discharge:  No   Has Patient had three or more failed trials of antipsychotic monotherapy by history:  No  Recommended Plan for Multiple Antipsychotic Therapies: Additional reason(s) for multiple antispychotic treatment:  na  Wonda Cerise 05/11/2012, 2:42 PM

## 2012-05-11 NOTE — Progress Notes (Signed)
Psychoeducational Group Note  Date:  05/11/2012 Time:  2000  Group Topic/Focus:  Wrap-Up Group:   The focus of this group is to help patients review their daily goal of treatment and discuss progress on daily workbooks.  Participation Level:  Minimal  Participation Quality:  Inattentive  Affect:  Flat  Cognitive:  Appropriate  Insight:  Good  Engagement in Group:  Good  Additional Comments:  Patient attended but did not participated much in group. He reports that he was fine and that he leave tomorrow. Today he did nothing but sleep. When asked about his support system, he stated that he did not want to talk about it  Scot Dock 05/11/2012, 9:36 PM

## 2012-05-11 NOTE — Progress Notes (Signed)
Patient ID: Anthony Haney, male   DOB: 1980/09/08, 31 y.o.   MRN: [id number] [facility] Group Notes:  (Counselor/Nursing/MHT/Case Management/Adjunct)  05/11/2012 11 AM  Type of Therapy:  Aftercare Planning, Group Therapy, Dance/Movement Therapy   Participation Level:  Did Not Attend  Modes of Intervention:  Clarification, Problem-solving, Role-play, Socialization and Support  Summary of Progress/Problems: Pt.did not attend aftercare planning group or counseling group. Pt. accepted information on suicide prevention, warning signs to look for with suicide and crisis line numbers to use. The pt. agreed to call crisis line numbers if having warning signs or having thoughts of suicide.     Debarah Crape  05/11/2012. 11:41 AM

## 2012-05-11 NOTE — Progress Notes (Signed)
D   Pt has been laying in bed all day  He does not participate in any activitys or interact with others   His 72 hour request for discharge is up around 5 pm today and MD has discharged pt but he is refusing to go because he has no place to stay tonight but he is also refusing to rescend the 72 hour request for discharge   He said he wants to stay until tomorrow A   Verbal support given   Q 15 min checks   Will continue to collaberate with other staff involved in his care for a solution to this problem R   Pt safe at present

## 2012-05-11 NOTE — Progress Notes (Signed)
Psychoeducational Group Note  Date:  05/11/2012 Time:  2000  Group Topic/Focus:  Wrap-Up Group:   The focus of this group is to help patients review their daily goal of treatment and discuss progress on daily workbooks.  Participation Level:  Minimal  Participation Quality:  Appropriate  Affect:  Appropriate  Cognitive:  Appropriate  Insight:  Good  Engagement in Group:  Limited  Additional Comments:  Patient attended and participated in group tonight. He advised that he don't believe her should be here. He signed the 72 hour papers and he want to go home. Pt. Reports that he slept all day today. He did not attend groups nor participated in any other activities.  Lita Mains [facility] 05/11/2012, 12:58 AM

## 2012-05-12 NOTE — BHH Suicide Risk Assessment (Signed)
Suicide Risk Assessment  Discharge Assessment     Demographic Factors:  Adolescent or young adult, Caucasian, Low socioeconomic status and Unemployed  Mental Status Per Nursing Assessment::   On Admission:  Suicidal ideation indicated by patient (patient stated he claimed suicidal ideations to be able to be admitted)  Current Mental Status by Physician: None  Loss Factors: NA  Historical Factors: None  Risk Reduction Factors:   Positive social support and Positive coping skills or problem solving skills  Continued Clinical Symptoms:  Alcohol/Substance Abuse/Dependencies  Cognitive Features That Contribute To Risk:  Thought constriction (tunnel vision)    Suicide Risk:  Minimal: No identifiable suicidal ideation.  Patients presenting with no risk factors but with morbid ruminations; may be classified as minimal risk based on the severity of the depressive symptoms  Discharge Diagnoses:   AXIS I:  Substance Abuse and Substance Induced Mood Disorder AXIS II:  Deferred AXIS Lazer:   Past Medical History  Diagnosis Date  . Drug abuse   . Hepatitis C   . Herpes    AXIS IV:  economic problems, occupational problems and problems related to social environment AXIS V:  51-60 moderate symptoms  Plan Of Care/Follow-up recommendations:  Activity:  As tolerated  Is patient on multiple antipsychotic therapies at discharge:  No   Has Patient had three or more failed trials of antipsychotic monotherapy by history:  No  Recommended Plan for Multiple Antipsychotic Therapies: N/A  Anwar Sakata A 05/12/2012, 3:10 PM

## 2012-05-12 NOTE — Progress Notes (Signed)
[facility] MD Progress Note  05/12/2012 3:01 PM  Diagnosis:  Axis I: Substance Abuse and Substance Induced Mood Disorder  ADL's:  Intact  Sleep: Fair  Appetite:  Good  Suicidal Ideation:  Plan:  None Homicidal Ideation:  Plan:  None  Anthony Haney endorses that he wants to be discharge today. He denies that he had any suicidal ideas when he came in. He admits that he lied and knew what he needed to say to get in here. He just wanted a place to be able to sleep.   AEB (as evidenced by):  Mental Status Examination/Evaluation: Objective:  Appearance: Casual  Eye Contact::  Fair  Speech:  Clear and Coherent  Volume:  Normal  Mood:  Irritable  Affect:  upset, wanting to be discharge  Thought Process:  Coherent  Orientation:  Full  Thought Content:  WDL  Suicidal Thoughts:  No  Homicidal Thoughts:  No  Memory:  Immediate;   Fair  Judgement:  Fair  Insight:  Shallow  Psychomotor Activity:  Normal  Concentration:  Fair  Recall:  Fair  Akathisia:  No  Handed:  Right  AIMS (if indicated):     Assets:  Social Support  Sleep:  Number of Hours: 4.25    Vital Signs:Blood pressure 105/66, pulse 73, temperature 97.5 F (36.4 C), temperature source Oral, resp. rate 16, height 5\' 7"  (1.702 m), weight 61.689 kg (136 lb). Current Medications: Current Facility-Administered Medications  Medication Dose Route Frequency Provider Last Rate Last Dose  . acetaminophen (TYLENOL) tablet 650 mg  650 mg Oral Q6H PRN Kerry Hough, PA      . alum & mag hydroxide-simeth (MAALOX/MYLANTA) 200-200-20 MG/5ML suspension 30 mL  30 mL Oral Q4H PRN Kerry Hough, PA      . dicyclomine (BENTYL) tablet 20 mg  20 mg Oral Q6H PRN Kerry Hough, PA   20 mg at 05/08/12 1302  . hydrOXYzine (ATARAX/VISTARIL) tablet 25 mg  25 mg Oral Q6H PRN Kerry Hough, PA   25 mg at 05/10/12 2141  . loperamide (IMODIUM) capsule 2-4 mg  2-4 mg Oral PRN Kerry Hough, PA      . magnesium hydroxide (MILK OF MAGNESIA) suspension 30 mL   30 mL Oral Daily PRN Kerry Hough, PA      . methocarbamol (ROBAXIN) tablet 500 mg  500 mg Oral Q8H PRN Kerry Hough, PA   500 mg at 05/09/12 0835  . multivitamin with minerals tablet 1 tablet  1 tablet Oral Daily Kerry Hough, PA   1 tablet at 05/09/12 0809  . naproxen (NAPROSYN) tablet 500 mg  500 mg Oral BID PRN Kerry Hough, PA      . nicotine (NICODERM CQ - dosed in mg/24 hours) patch 21 mg  21 mg Transdermal Daily Nelly Rout, MD   21 mg at 05/09/12 0814  . ondansetron (ZOFRAN-ODT) disintegrating tablet 4 mg  4 mg Oral Q6H PRN Kerry Hough, PA      . thiamine (B-1) injection 100 mg  100 mg Intramuscular Once Kerry Hough, PA      . thiamine (VITAMIN B-1) tablet 100 mg  100 mg Oral Daily Kerry Hough, PA   100 mg at 05/09/12 0809  . traZODone (DESYREL) tablet 50 mg  50 mg Oral QHS Verne Spurr, PA-C   50 mg at 05/11/12 2214  . valACYclovir (VALTREX) tablet 500 mg  500 mg Oral Daily Larena Sox, MD  Lab Results: No results found for this or any previous visit (from the past 48 hour(s)).  Physical Findings: AIMS: Facial and Oral Movements Muscles of Facial Expression: None, normal Lips and Perioral Area: None, normal Jaw: None, normal Tongue: None, normal,Extremity Movements Upper (arms, wrists, hands, fingers): None, normal Lower (legs, knees, ankles, toes): None, normal, Trunk Movements Neck, shoulders, hips: None, normal, Overall Severity Severity of abnormal movements (highest score from questions above): None, normal Incapacitation due to abnormal movements: None, normal Patient's awareness of abnormal movements (rate only patient's report): No Awareness, Dental Status Current problems with teeth and/or dentures?: No  CIWA:  CIWA-Ar Total: 0  COWS:     Treatment Plan Summary: Daily contact with patient to assess and evaluate symptoms and progress in treatment Medication management  Plan: Will hold the discharge until tomorrow at 7 AM when  his mother is going to come to pick him up and take him to [facility]  Jo-Anne Kluth A 05/12/2012, 3:01 PM

## 2012-05-12 NOTE — Progress Notes (Signed)
Patient ID: Mychael Kabe Mckoy, male   DOB: November 10, 1980, 31 y.o.   MRN: [id number] Patient remains irritable and labile.  He antagonizes other patients and demanded to be discharged this morning.  He came to the treatment team meeting this morning before he was called in and asked about his discharge which he was told he was not ready to be seen yet.  He did not have on a shirt.  He went back to his room and took off all his clothes and went to bed.  When he was approached to come back to treatment team, he would not get out of bed and asked for the treatment team to come to him.  He was asked again by staff to come down to the treatment team room and he got out of bed naked and angry.  He proceeded to come to treatment team after he dressed and refused to give out a telephone number for his contact.  He eventually yelled out the number and also loudly stated that he said in the ER that he was suicidal to get into [facility] so he could get here and get "some rest."  He stated, "I knew what to say so I could get in here.".  Patient will be readied for discharge for early release for 0700.  He will be discharged to [facility].  He denies SI/HI/AVH.
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
## 2012-05-12 NOTE — Discharge Planning (Signed)
Anthony Haney refused to attend AM group-told me he was leaving today and to just give him a bus pass.  Called his mother to confirm.  She passed me on to his sister, who made it clear he is persona non grata at home, but she is willing to transport him to [facility] in the AM.  Told her to be here at [employer].  Informed Anthony Haney of the plan.  His response-"I don't have mother."

## 2012-05-12 NOTE — Progress Notes (Signed)
Patient signed discharge paperwork.  He is ready for discharge 05/13/12 at 0700.  Patient is to be given discharge paperwork and belongings from locker.  If he refuses, call security to have him escorted from the property.

## 2012-05-12 NOTE — Progress Notes (Signed)
[facility] Case Management Discharge Plan:  Will you be returning to the same living situation after discharge: No. At discharge, do you have transportation home?:Yes,  sister Do you have the ability to pay for your medications:Yes,  insurance  Interagency Information:     Release of information consent forms completed and in the chart;  Patient's signature needed at discharge.  Patient to Follow up at:  Follow-up Information    Follow up with [facility] rehab. On 05/13/2012. (8 AM sharp for screening for admission)    Contact information:   [address] [facility]  [phone]         Patient denies SI/HI:   Yes,  yes    Safety Planning and Suicide Prevention discussed:  Yes,  yes  Barrier to discharge identified:No.  Summary and Recommendations:   Anthony Haney 05/12/2012, 2:11 PM

## 2012-05-13 DIAGNOSIS — F602 Antisocial personality disorder: Secondary | ICD-10-CM

## 2012-05-13 DIAGNOSIS — F1910 Other psychoactive substance abuse, uncomplicated: Secondary | ICD-10-CM

## 2012-05-13 DIAGNOSIS — F1320 Sedative, hypnotic or anxiolytic dependence, uncomplicated: Secondary | ICD-10-CM

## 2012-05-13 NOTE — Progress Notes (Signed)
Patient Discharge Instructions:  After Visit Summary (AVS):   Faxed to:  05/13/12 Discharge Summary Note:   Faxed to:  05/13/12 Psychiatric Admission Assessment Note:   Faxed to:  05/13/12 Suicide Risk Assessment - Discharge Assessment:   Faxed to:  05/13/12 Faxed/Sent to the Next Level Care provider:  05/13/12  Faxed to [facility] @ [phone]  Karleen Hampshire Brittini, 05/13/2012, 4:05 PM

## 2012-05-13 NOTE — Progress Notes (Signed)
[facility] Adult Inpatient Family/Significant Other Suicide Prevention Education  Suicide Prevention Education:  Patient Refusal for Family/Significant Other Suicide Prevention Education: The patient Anthony Haney has refused to provide written consent for family/significant other to be provided Family/Significant Other Suicide Prevention Education during admission and/or prior to discharge.  Physician notified.  Tahani Potier, Aram Beecham 05/13/2012, 2:17 PM

## 2012-05-13 NOTE — Progress Notes (Signed)
Patient ID: Anthony Haney, male   DOB: 02/11/1981, 31 y.o.   MRN: [id number]  D:  Pt wouldn't engage the writer in conversation and also refused scheduled hs meds.   A: Support and encouragement was offered. 15 min checks continued for safety.  R:  Pt remains safe.

## 2012-05-13 NOTE — Progress Notes (Signed)
Psychoeducational Group Note  Date:  05/12/2012 Time:  2020  Group Topic/Focus:  Wrap-Up Group:   The focus of this group is to help patients review their daily goal of treatment and discuss progress on daily workbooks.  Participation Level:  None  Participation Quality:  Inattentive  Affect:  Irritable  Cognitive:  Oriented  Insight:  None  Engagement in Group:  None  Additional Comments:  Patient attended group but read magazine throughout the entire group time and said "PASS" when this writer asked if patient would like to share.  Sharnae Winfree, Newton Pigg 05/13/2012, 3:25 AM

## 2012-05-13 NOTE — Progress Notes (Signed)
Pt was laying in bed when the writer went to retrieve for discharge. MHT informed the writer that pt would not get out of the bed after an hour of prompting. After getting out of bed, pt req to shower, wash hair and use the bathroom. All of which were denied. Pt cussed the Clinical research associate and staff, and informed he was going to kill himself. Writer reinforced that to pt that he was going to a safe rehab. Pt continued to argue, and reluctantly cooperated with staff.

## 2012-05-20 NOTE — Discharge Summary (Signed)
Physician Discharge Summary Note  Patient:  Anthony Haney is an 31 y.o., male MRN:  [id number] DOB:  10-07-1980 Patient phone:  [phone] (home)  Patient address:   [address] [place] [address]   Date of Admission:  05/07/2012 Date of Discharge: 05/13/2012  Discharge Diagnoses: Principal Problem:  *Benzodiazepine dependence Active Problems:  Substance abuse withdrawal  Polysubstance abuse  Axis Diagnosis:  Anti-social personality disorder  Level of Care:  [facility]  Hospital Course:   Anthony Haney's hospitalization was difficult and brief.  He stated he was suicidal with plans to kill himself inorder to gain admission.  During his stay at [facility] he refused to attend groups.  He was rude to the staff and demanding of special treatment. He was offensive and hostile to other patients.  He demanded discharge over the weekend shortly after his arrival and then abruptly sabotaged his discharge in order to sleep.      On Monday morning he was evaluated by the treatment team and was rude and uncooperative, refusing to provide contact information to his parents. He refused to make eye contact, and shouted at the treatment team, to "give me my  f%$#ing bus ticket and let him get out of here!" He then followed up with "yes I lied to get in here, cause I didn't want to go to rehab, I wanted to sleep." His parents were unable to pick him up that day and [facility] at [facility] was not ready until the next morning.  All felt that it was in his best interest to leave directly from [facility] and do to [facility] from here.     Again the next morning he refused to leave at the time his discharge was completed and had to be assisted out of the building by security. Medication List   As of 05/20/2012 10:01 PM  STOP taking these medications     alprazolam 2 MG tablet   Commonly known as: XANAX      hydrOXYzine 50 MG capsule   Commonly known as: VISTARIL      traZODone 150 MG tablet   Commonly known as: DESYREL     valACYclovir 500 MG tablet   Commonly known as: VALTREX     Consults:  none  Significant Diagnostic Studies:  Labs:   Discharge Vitals:   Blood pressure 105/66, pulse 73, temperature 97.5 F (36.4 C), temperature source Oral, resp. rate 16, height 5\' 7"  (1.702 m), weight 61.689 kg (136 lb)..  Mental Status Exam: See Mental Status Examination and Suicide Risk Assessment completed by Attending Physician prior to discharge.  Discharge destination:  None  Is patient on multiple antipsychotic therapies at discharge:  No  Has Patient had three or more failed trials of antipsychotic monotherapy by history: N/A Recommended Plan for Multiple Antipsychotic Therapies: N/A Discharge Orders    Future Orders Please Complete By Expires   Diet - low sodium heart healthy      Increase activity slowly      Discharge instructions      Comments:   Go directly to [facility]. They are expecting you by 8AM.   Medication List   As of 05/20/2012 10:02 PM  STOP taking these medications         alprazolam 2 MG tablet   Commonly known as: XANAX      hydrOXYzine 50 MG capsule   Commonly known as: VISTARIL      traZODone 150 MG tablet   Commonly known as:  DESYREL      valACYclovir 500 MG tablet   Commonly known as: VALTREX       Follow-up Information    Follow up with [facility] rehab. On 05/13/2012. (8 AM sharp for screening for admission)    Contact information:   [address]  [facility]  [phone]   Follow-up recommendations:   Activities: Resume typical activities Diet: Resume typical diet Tests:  Other: Follow up with outpatient provider and report any side effects to out patient prescriber.  Comments:   Due to Anthony Haney's volatile nature, poor motivation, and polarization and splitting behaviors toward the staff and other residents, it is recommended that he not be readmitted to [facility] within the next 6 months.  Signed: Rona Ravens. Jearldean Gutt [facility] 05/20/2012  10:02 PM

## 2012-05-28 NOTE — Discharge Summary (Signed)
I have read and agree with this assessment.  Dr. Reymundo Poll. [facility] 05/28/2012 12:16 PM

## 2013-04-10 ENCOUNTER — Emergency Department (HOSPITAL_BASED_OUTPATIENT_CLINIC_OR_DEPARTMENT_OTHER)
Admission: EM | Admit: 2013-04-10 | Discharge: 2013-04-10 | Disposition: A | Discharge status: Home / Self Care | Payer: BC Managed Care – PPO | Attending: Emergency Medicine | Admitting: Emergency Medicine

## 2013-04-10 ENCOUNTER — Encounter (HOSPITAL_BASED_OUTPATIENT_CLINIC_OR_DEPARTMENT_OTHER): Payer: Self-pay | Admitting: Emergency Medicine

## 2013-04-10 DIAGNOSIS — Z79899 Other long term (current) drug therapy: Secondary | ICD-10-CM | POA: Insufficient documentation

## 2013-04-10 DIAGNOSIS — R6883 Chills (without fever): Secondary | ICD-10-CM | POA: Insufficient documentation

## 2013-04-10 DIAGNOSIS — R1012 Left upper quadrant pain: Secondary | ICD-10-CM | POA: Insufficient documentation

## 2013-04-10 DIAGNOSIS — Z8619 Personal history of other infectious and parasitic diseases: Secondary | ICD-10-CM | POA: Insufficient documentation

## 2013-04-10 DIAGNOSIS — R1032 Left lower quadrant pain: Secondary | ICD-10-CM | POA: Insufficient documentation

## 2013-04-10 DIAGNOSIS — F17200 Nicotine dependence, unspecified, uncomplicated: Secondary | ICD-10-CM | POA: Insufficient documentation

## 2013-04-10 DIAGNOSIS — R197 Diarrhea, unspecified: Secondary | ICD-10-CM | POA: Insufficient documentation

## 2013-04-10 DIAGNOSIS — R1110 Vomiting, unspecified: Secondary | ICD-10-CM

## 2013-04-10 LAB — URINALYSIS, ROUTINE W REFLEX MICROSCOPIC
Ketones, ur: NEGATIVE mg/dL
Leukocytes, UA: NEGATIVE
Nitrite: NEGATIVE
Protein, ur: NEGATIVE mg/dL
pH: 8 (ref 5.0–8.0)

## 2013-04-10 LAB — CBC WITH DIFFERENTIAL/PLATELET
Basophils Relative: 0 % (ref 0–1)
Eosinophils Absolute: 0 10*3/uL (ref 0.0–0.7)
Eosinophils Relative: 0 % (ref 0–5)
Lymphs Abs: 1.5 10*3/uL (ref 0.7–4.0)
MCH: 34.6 pg — ABNORMAL HIGH (ref 26.0–34.0)
MCHC: 33.1 g/dL (ref 30.0–36.0)
MCV: 104.6 fL — ABNORMAL HIGH (ref 78.0–100.0)
Neutrophils Relative %: 67 % (ref 43–77)
Platelets: 237 10*3/uL (ref 150–400)
RDW: 12 % (ref 11.5–15.5)

## 2013-04-10 LAB — COMPREHENSIVE METABOLIC PANEL
Alkaline Phosphatase: 62 U/L (ref 39–117)
BUN: 12 mg/dL (ref 6–23)
Calcium: 9.6 mg/dL (ref 8.4–10.5)
Creatinine, Ser: 0.8 mg/dL (ref 0.50–1.35)
GFR calc Af Amer: >90 mL/min (ref >90)
Glucose, Bld: 115 mg/dL — ABNORMAL HIGH (ref 70–99)
Potassium: 3.6 mEq/L (ref 3.5–5.1)
Total Protein: 7.4 g/dL (ref 6.0–8.3)

## 2013-04-10 MED ORDER — ONDANSETRON HCL 4 MG/2ML IJ SOLN
4.0000 mg | Freq: Once | INTRAMUSCULAR | Status: AC
  Administered 2013-04-10: 4 mg via INTRAVENOUS
  Filled 2013-04-10: qty 2

## 2013-04-10 MED ORDER — ONDANSETRON HCL 4 MG PO TABS: 4.0000 mg | ORAL_TABLET | Freq: Four times a day (QID) | ORAL | Status: DC

## 2013-04-10 MED ORDER — SODIUM CHLORIDE 0.9 % IV BOLUS (SEPSIS)
1000.0000 mL | Freq: Once | INTRAVENOUS | Status: AC
  Administered 2013-04-10: 1000 mL via INTRAVENOUS

## 2013-04-10 NOTE — ED Notes (Signed)
Ginger ale provided.

## 2013-04-10 NOTE — ED Notes (Signed)
Pt had no vomiting after drinking gingerale

## 2013-04-10 NOTE — ED Notes (Signed)
Care assumed for relief.

## 2013-04-10 NOTE — ED Provider Notes (Signed)
CSN: [id number]     Arrival date & time 04/10/13  1503 History   First MD Initiated Contact with Patient 04/10/13 1518     Chief Complaint  Patient presents with  . Emesis   (Consider location/radiation/quality/duration/timing/severity/associated sxs/prior Treatment) Patient is a 32 y.o. male presenting with vomiting. The history is provided by the patient.  Emesis Severity:  Moderate Duration:  10 days Timing:  Intermittent Quality:  Stomach contents Progression:  Unchanged Chronicity:  New Recent urination:  Decreased Relieved by: Zofran. Associated symptoms: abdominal pain (cramping, intermittent\), chills and diarrhea (for a few days, now resolved.)   Associated symptoms: no fever     Past Medical History  Diagnosis Date  . Drug abuse   . Hepatitis C   . Herpes    Past Surgical History  Procedure Laterality Date  . Hand surgery      Left   History reviewed. No pertinent family history. History  Substance Use Topics  . Smoking status: Current Every Day Smoker -- 1.00 packs/day for 10 years    Types: Cigarettes  . Smokeless tobacco: Not on file  . Alcohol Use: Yes     Comment: 2 per month    Review of Systems  Constitutional: Positive for chills. Negative for fever.  HENT: Negative for congestion.   Respiratory: Negative for cough and shortness of breath.   Cardiovascular: Negative for chest pain.  Gastrointestinal: Positive for vomiting, abdominal pain (cramping, intermittent\) and diarrhea (for a few days, now resolved.). Negative for nausea.  Genitourinary: Negative for scrotal swelling and testicular pain.  All other systems reviewed and are negative.    Allergies  Review of patient's allergies indicates no known allergies.  Home Medications   Current Outpatient Rx  Name  Route  Sig  Dispense  Refill  . alprazolam (XANAX) 2 MG tablet   Oral   Take 2 mg by mouth 3 (three) times daily as needed for sleep.         Marland Kitchen PRESCRIPTION MEDICATION     Hepatitis C medications.         Marland Kitchen QUEtiapine (SEROQUEL) 400 MG tablet   Oral   Take 400 mg by mouth at bedtime.         . valACYclovir (VALTREX) 500 MG tablet   Oral   Take 500 mg by mouth daily.          BP 125/91  Pulse 125  Temp(Src) 99.1 F (37.3 C) (Oral)  Resp 16  Ht 5\' 8"  (1.727 m)  Wt 116 lb (52.617 kg)  BMI 17.64 kg/m2  SpO2 100% Physical Exam  Nursing note and vitals reviewed. Constitutional: He is oriented to person, place, and time. He appears well-developed and well-nourished. No distress.  HENT:  Head: Normocephalic and atraumatic.  Mouth/Throat: Oropharynx is clear and moist.  Eyes: Conjunctivae are normal. Pupils are equal, round, and reactive to light. No scleral icterus.  Neck: Neck supple.  Cardiovascular: Normal rate, regular rhythm, normal heart sounds and intact distal pulses.   No murmur heard. Pulmonary/Chest: Effort normal and breath sounds normal. No stridor. No respiratory distress. He has no wheezes. He has no rales.  Abdominal: Soft. He exhibits no distension. There is tenderness in the left upper quadrant and left lower quadrant. There is no rigidity, no rebound and no guarding. Hernia confirmed negative in the right inguinal area and confirmed negative in the left inguinal area.  Genitourinary: Penis normal. Right testis shows no mass, no swelling and no tenderness. Left  testis shows no mass, no swelling and no tenderness.  Musculoskeletal: Normal range of motion. He exhibits no edema.  Neurological: He is alert and oriented to person, place, and time.  Skin: Skin is warm and dry. No rash noted.  Psychiatric: He has a normal mood and affect. His behavior is normal.    ED Course  Procedures (including critical care time) Labs Review Labs Reviewed  CBC WITH DIFFERENTIAL - Abnormal; Notable for the following:    RBC 3.50 (*)    Hemoglobin 12.1 (*)    HCT 36.6 (*)    MCV 104.6 (*)    MCH 34.6 (*)    All other components within normal  limits  COMPREHENSIVE METABOLIC PANEL - Abnormal; Notable for the following:    Glucose, Bld 115 (*)    Total Bilirubin 1.8 (*)    All other components within normal limits  URINALYSIS, ROUTINE W REFLEX MICROSCOPIC   Imaging Review No results found.  MDM   1. Vomiting    10 days of vomiting and intermittent cramping abdominal pain.  Well appearing, but tachycardic.  Abdomen soft, tender on left side with no r/r/g.  Plan IV fluids, labs.    Labs unremarkable.  Repeat abdominal exam benign.  HR improved with fluids.  Tolerated PO.  Will follow up with primary doctor.  Return precautions given.    Candyce Churn, MD 04/10/13 Paulo Fruit

## 2013-04-10 NOTE — ED Notes (Signed)
N/V x 10 days.  Some chills and hot sweats.  No diarrhea.  Some lower back pain and lower abdominal pain.

## 2014-01-18 ENCOUNTER — Emergency Department (HOSPITAL_COMMUNITY)
Admission: EM | Admit: 2014-01-18 | Discharge: 2014-01-18 | Disposition: A | Discharge status: Home / Self Care | Payer: BC Managed Care – PPO | Attending: Emergency Medicine | Admitting: Emergency Medicine

## 2014-01-18 ENCOUNTER — Encounter (HOSPITAL_COMMUNITY): Payer: Self-pay | Admitting: Emergency Medicine

## 2014-01-18 DIAGNOSIS — F1410 Cocaine abuse, uncomplicated: Secondary | ICD-10-CM | POA: Insufficient documentation

## 2014-01-18 DIAGNOSIS — F1910 Other psychoactive substance abuse, uncomplicated: Secondary | ICD-10-CM

## 2014-01-18 DIAGNOSIS — Z8619 Personal history of other infectious and parasitic diseases: Secondary | ICD-10-CM | POA: Insufficient documentation

## 2014-01-18 DIAGNOSIS — Z79899 Other long term (current) drug therapy: Secondary | ICD-10-CM | POA: Insufficient documentation

## 2014-01-18 DIAGNOSIS — F1120 Opioid dependence, uncomplicated: Secondary | ICD-10-CM | POA: Insufficient documentation

## 2014-01-18 DIAGNOSIS — F17200 Nicotine dependence, unspecified, uncomplicated: Secondary | ICD-10-CM | POA: Insufficient documentation

## 2014-01-18 NOTE — ED Notes (Signed)
Pt refuses to get off the phone foe triage.

## 2014-01-18 NOTE — ED Notes (Signed)
Bed: WA07 Expected date:  Expected time:  Means of arrival:  Comments: EMS 

## 2014-01-18 NOTE — ED Provider Notes (Signed)
CSN: [id number]     Arrival date & time 01/18/14  1734 History   First MD Initiated Contact with Patient 01/18/14 1740     Chief Complaint  Patient presents with  . Toxic Inhalation      HPI Patient presents emergency department because of ongoing substance abuse.  He states he's having, using heroin, using cocaine.  He requests assistance was polysubstance abuse.  No homicidal or suicidal thoughts.  He states earlier he felt like maybe one to die but now has decided that death is not something he wants.  [place].  He does not know anyone in [place].   Past Medical History  Diagnosis Date  . Drug abuse   . Hepatitis C   . Herpes    Past Surgical History  Procedure Laterality Date  . Hand surgery      Left   No family history on file. History  Substance Use Topics  . Smoking status: Current Every Day Smoker -- 1.00 packs/day for 10 years    Types: Cigarettes  . Smokeless tobacco: Not on file  . Alcohol Use: Yes     Comment: 2 per month    Review of Systems  All other systems reviewed and are negative.     Allergies  Review of patient's allergies indicates no known allergies.  Home Medications   Prior to Admission medications   Medication Sig Start Date End Date Taking? Authorizing Provider  alprazolam Prudy Feeler(XANAX) 2 MG tablet Take 2 mg by mouth 3 (three) times daily as needed for sleep or anxiety.    Yes Historical Provider, MD  QUEtiapine (SEROQUEL) 400 MG tablet Take 400 mg by mouth at bedtime.   Yes Historical Provider, MD  valACYclovir (VALTREX) 500 MG tablet Take 500 mg by mouth daily.   Yes Historical Provider, MD   BP 129/86  Pulse 104  Resp 20  SpO2 97% Physical Exam  Nursing note and vitals reviewed. Constitutional: He is oriented to person, place, and time. He appears well-developed and well-nourished.  HENT:  Head: Normocephalic and atraumatic.  Eyes: EOM are normal.  Neck: Normal range of motion.  Cardiovascular:  Normal rate, regular rhythm, normal heart sounds and intact distal pulses.   Pulmonary/Chest: Effort normal and breath sounds normal. No respiratory distress.  Abdominal: Soft. He exhibits no distension. There is no tenderness.  Musculoskeletal: Normal range of motion.  Neurological: He is alert and oriented to person, place, and time.  Skin: Skin is warm and dry.  Psychiatric: He has a normal mood and affect. Judgment normal.  Denies suicidal and homicidal thoughts    ED Course  Procedures (including critical care time) Labs Review Labs Reviewed - No data to display  Imaging Review No results found.   EKG Interpretation None      MDM   Final diagnoses:  Substance abuse    Substance abuse without suicidal thoughts.  Discharge in good condition.  Outpatient resources given for his substance abuse   Lyanne CoKevin M Camille Thau, MD 01/18/14 [phone]

## 2014-01-18 NOTE — Discharge Instructions (Signed)
°Emergency Department Resource Guide °1) Find a Doctor and Pay Out of Pocket °Although you won't have to find out who is covered by your insurance plan, it is a good idea to ask around and get recommendations. You will then need to call the office and see if the doctor you have chosen will accept you as a new patient and what types of options they offer for patients who are self-pay. Some doctors offer discounts or will set up payment plans for their patients who do not have insurance, but you will need to ask so you aren't surprised when you get to your appointment. ° °2) Contact Your Local Health Department °Not all health departments have doctors that can see patients for sick visits, but many do, so it is worth a call to see if yours does. If you don't know where your local health department is, you can check in your phone book. The CDC also has a tool to help you locate your state's health department, and many state websites also have listings of all of their local health departments. ° °3) Find a Walk-in Clinic °If your illness is not likely to be very severe or complicated, you may want to try a walk in clinic. These are popping up all over the country in pharmacies, drugstores, and shopping centers. They're usually staffed by nurse practitioners or physician assistants that have been trained to treat common illnesses and complaints. They're usually fairly quick and inexpensive. However, if you have serious medical issues or chronic medical problems, these are probably not your best option. ° °No Primary Care Doctor: °- Call Health Connect at  832-8000 - they can help you locate a primary care doctor that  accepts your insurance, provides certain services, etc. °- Physician Referral Service- 1-800-533-3463 ° °Chronic Pain Problems: °Organization         Address  Phone   Notes  °[facility]  (336) 297-2271 Patients need to be referred by their primary care doctor.  ° °Medication  Assistance: °Organization         Address  Phone   Notes  °[place] [facility] [address] °[place], [place] [address] (336) 641-8030 --Must be a resident of [place] °-- Must have NO insurance coverage whatsoever (no Medicaid/ Medicare, etc.) °-- The pt. MUST have a primary care doctor that directs their care regularly and follows them in the community °  °[facility]  (866) 331-1348   °United Way  (888) 892-1162   ° °Agencies that provide inexpensive medical care: °Organization         Address  Phone   Notes  °[facility]  (336) 832-8035   °[facility]    (336) 832-7272   °[facility] [address] °[place], [place] [address] (336) 832-4777   °[facility] [address], °[place] (336) 271-4999   °[facility]    (336) 373-0678   °[facility]    (336) 272-1050   °[facility] ° [address], [place] Phone:  (336) 832-4444, Fax:  (336) 832-4440 Hours of Operation:  9 am - 6 pm, M-F.  Also accepts Medicaid/Medicare and self-pay.  °[facility] ° [address], [place] Phone: (336) 832-3150, Fax: (336) 832-3151. Hours of Operation:  8:30 am - 5:30 pm, M-F.  Also accepts Medicaid and self-pay.  °[facility] [place] [address], [place] Phone: (336) 878-6027   °[facility] [address], [place], [place] (336)723-1848, Ext. 123 Mondays & Thursdays: 7-9 AM.  First 15 patients are seen on a first come, first serve basis. °  ° °Medicaid-accepting [place] Providers: ° °Organization         Address  Phone   Notes  °[facility] [address], [place] (336) 641-2100 Also accepts self-pay patients.  °[facility] [address], [place] ° (336) 856-9996   °[facility] [address], [place]  (336) 288-8857   °[facility] [address] [place] [address], [place] (336) 299-7000   °Veita Bland [address], [place]  ° (336) 373-1557 Only accepts [place] Access Medicaid patients after they have their name applied to their card.  ° °Self-Pay (no insurance) in [place]: ° °Organization         Address  Phone   Notes  °Sickle Cell Patients, [facility] [address], [place] (336) 832-1970   °[facility] [address], [place] (336) 832-4400   °[facility] ° [address], [place] (336) 992-4800   °[facility]/Dr. Osei-Bonsu ° [address] [place] [address], [place] or [address], [place] (336) 841-8500 Phone number for both [place] and [place] locations is the same.  °[facility] [address], [place] (336) 299-0000   °[facility] [address] [place] [address], [place] or [address] (336) 852-7530 °(336) 878-2260   °[facility] [address], [place] (336) 350-1642, phone; (336) 294-5005, fax Sees patients 1st and 3rd Saturday of every month.  Must not qualify for public or private insurance (i.e. Medicaid, Medicare, [place] Health Choice, Veterans' Benefits) • Household income should be no more than 200% of the poverty level •The clinic cannot treat you if you are pregnant or think you are pregnant • Sexually transmitted diseases are not treated at the clinic.  ° ° °Dental Care: °Organization         Address  Phone  Notes  °[place] [facility] [address],  (336) 641-6152 Accepts children up to age 21 who are enrolled in Medicaid or [place] Health Choice; pregnant women with a Medicaid card; and children who have applied for Medicaid or [place] Health Choice, but were declined, whose parents can pay a reduced fee at time of service.  °[place]  [facility] [place]  [address], [place] (336) 641-7733 Accepts children up to age 21 who are enrolled in Medicaid or [place] Health Choice; pregnant women with a Medicaid card; and children who have applied for Medicaid or  Health Choice, but were declined, whose parents can pay a reduced fee at time of service.  °[facility] ° [address],  (336) 641-4533 Patients are seen by appointment only. Walk-ins are not accepted. [facility] will see patients 18 years of age and older. °Monday - Tuesday (8am-5pm) °Most Wednesdays (8:30-5pm) °$30 per visit, cash only  °[facility] ° [address], [place] (336) 641-4533 Patients are seen by appointment only. Walk-ins are not accepted. [facility] will see patients 18 years of age and older. °One   Wednesday Evening (Monthly: Volunteer Based).  $30 per visit, cash only  °[facility]  (919) 537-3737 for adults; Children under age 4, call Graduate Pediatric Dentistry at ([phone]) [phone]. Children aged 4-14, please call (919) [phone] to request a pediatric application. ° Dental services are provided in all areas of dental care including fillings, crowns and bridges, complete and partial dentures, implants, gum treatment, root canals, and extractions. Preventive care is also provided. Treatment is provided to both adults and children. °Patients are selected via a lottery and there is often a waiting list. °  °[facility] [address], °[place] ° (336) 763-8833 www.drcivils.com °  °[facility] [address], [place], [place] (336)723-1848, Ext. 123 Second and Fourth Thursday of each month, opens at 6:30 AM; Clinic ends at 9 AM.  Patients are seen on a first-come first-served basis, and a limited number are seen during each clinic.  ° °[facility] ° [address], [place], [place] (336) 723-7904    Eligibility Requirements °You must have lived in [place], [place], or [place] counties for at least the last three months. °  You cannot be eligible for state or federal sponsored healthcare insurance, including Veterans Administration, Medicaid, or Medicare. °  You generally cannot be eligible for healthcare insurance through your employer.  °  How to apply: °Eligibility screenings are held every Tuesday and Wednesday afternoon from 1:00 pm until 4:00 pm. You do not need an appointment for the interview!  °[facility] [address], [place], [place] 336-631-2330   °[place] [facility]  336-342-8273   °[place] [facility]  336-703-3100   °[facility]  336-570-6415   ° °Behavioral Health Resources in the Community: °Intensive Outpatient Programs °Organization         Address  Phone  Notes  °[facility] [address], [place], [place] 336-878-6098   °[facility] [address], [place], [place] 336-832-9800   °[facility] [address], [place], [place] ° 336-882-2125   °[facility] [address],  °[place], [place] 1-800-853-5163 or 336-641-4981   °Substance Abuse Resources °Organization         Address  Phone  Notes  °[facility]  336-882-2125   °[facility]  336-784-9470   °[facility]  336-285-9073   °[facility]  336-845-3988   °Residential & Outpatient Substance Abuse Program  1-800-659-3381   °Psychological Services °Organization         Address  Phone  Notes  °[facility]  336- 832-9600   °[facility]  336- 378-7881   °[facility] [address], [place] 1-800-853-5163 or 336-641-4981   ° °Mobile Crisis Teams °Organization         Address  Phone  Notes  °[facility], Mobile Crisis Care Unit  1-877-626-1772   °[facility] ° [address]  [place], [place] 336-834-9664   °Sharon DeEsch [address] °[place]  336-554-5454   ° °Self-Help/Support Groups °Organization         Address  Phone             Notes  °[facility]oc[facility] of  - variety of support groups  336- 373-1402 Call for more information  °Narcotics Anonymous (NA), [facility] [address], °[place]   2 meetings at this location  ° °  Residential Treatment Programs °Organization         Address  Phone  Notes  °[facility] [address],    °[place]  1-866-801-8205   °[facility] ° [address], [place], [place] 704-293-8524   °[facility] [address], [place] 336-845-3988 Admissions: 8am-3pm M-F  °[facility] [address]    °[place], [place] 336-841-1104   °[facility] [address], [place], [place] 336-379-7146   °[facility] [address]  °[place], [place] 336-285-9073   °[facility] Intensive Outpatient [address], [place], [place] 336-852-3033   °[facility] ([facility] [address]  °[place], [place] 1-877-615-2722 or 336-784-9470   °[facility] ([facility]) [address] [place], [place] 336-227-7417 Accepts Medicaid  °[facility] [address]  °[place] 1-800-659-3381 Substance Abuse/Addiction Treatment  ° °[place] Behavioral Health Resources °Organization         Address  Phone  Notes  °[facility]  (888) 581-9988   °Julie Brannon, PhD [address] [place], [place]   (336) 349-5553 or (336) 951-0000   °[facility]   [address] °[place], [place] (336) 349-4454   °[facility] [address], [place], [place] (336) 342-8316 Insurance/Medicaid/sponsorship through [facility]  °[facility] [address]                                    [place], [place] (336) 342-8316 Therapy/tele-psych/case    °[facility] [address]  ° [place], [place] (336) 349-2233    °Dr. Arfeen  (336) 349-4544   °[facility] [place]  [employer] [place] [facility] 1) [address], [place] °2) [address], [place] °3)  [address], [place] (336) 349-3220 °(336) 342-7768 ° °(336) 342-8140   °[place] Child Abuse Hotline (336) 342-1394 or (336) 342-3537 (After Hours)    ° ° °

## 2014-01-18 NOTE — ED Notes (Signed)
Pt presents with c/o sniffing compressed air. [facility] and was discharged. Per EMS, pt was doing this because he wanted to kill himself. Calm and cooperative per EMS.

## 2014-01-18 NOTE — ED Notes (Signed)
Pt says that he doesn't care if he dies but he has no plan to kill himself.

## 2014-01-20 ENCOUNTER — Emergency Department (HOSPITAL_BASED_OUTPATIENT_CLINIC_OR_DEPARTMENT_OTHER)
Admission: EM | Admit: 2014-01-20 | Discharge: 2014-01-20 | Disposition: A | Discharge status: Home / Self Care | Payer: BC Managed Care – PPO | Attending: Emergency Medicine | Admitting: Emergency Medicine

## 2014-01-20 ENCOUNTER — Encounter (HOSPITAL_BASED_OUTPATIENT_CLINIC_OR_DEPARTMENT_OTHER): Payer: Self-pay | Admitting: Emergency Medicine

## 2014-01-20 DIAGNOSIS — F1210 Cannabis abuse, uncomplicated: Secondary | ICD-10-CM | POA: Insufficient documentation

## 2014-01-20 DIAGNOSIS — F1410 Cocaine abuse, uncomplicated: Secondary | ICD-10-CM | POA: Insufficient documentation

## 2014-01-20 DIAGNOSIS — F1910 Other psychoactive substance abuse, uncomplicated: Secondary | ICD-10-CM

## 2014-01-20 DIAGNOSIS — Z79899 Other long term (current) drug therapy: Secondary | ICD-10-CM | POA: Insufficient documentation

## 2014-01-20 DIAGNOSIS — F17200 Nicotine dependence, unspecified, uncomplicated: Secondary | ICD-10-CM | POA: Insufficient documentation

## 2014-01-20 DIAGNOSIS — Z8619 Personal history of other infectious and parasitic diseases: Secondary | ICD-10-CM | POA: Insufficient documentation

## 2014-01-20 DIAGNOSIS — R000 Tachycardia, unspecified: Secondary | ICD-10-CM | POA: Insufficient documentation

## 2014-01-20 LAB — BASIC METABOLIC PANEL
BUN: 13 mg/dL (ref 6–23)
CALCIUM: 8.9 mg/dL (ref 8.4–10.5)
CO2: 24 meq/L (ref 19–32)
CREATININE: 0.9 mg/dL (ref 0.50–1.35)
Chloride: 104 mEq/L (ref 96–112)
GFR calc Af Amer: >90 mL/min (ref >90)
GLUCOSE: 99 mg/dL (ref 70–99)
Potassium: 3.3 mEq/L — ABNORMAL LOW (ref 3.7–5.3)
SODIUM: 142 meq/L (ref 137–147)

## 2014-01-20 LAB — CBC WITH DIFFERENTIAL/PLATELET
Basophils Absolute: 0.1 10*3/uL (ref 0.0–0.1)
Basophils Relative: 1 % (ref 0–1)
EOS ABS: 0.2 10*3/uL (ref 0.0–0.7)
EOS PCT: 1 % (ref 0–5)
HEMATOCRIT: 44.8 % (ref 39.0–52.0)
Hemoglobin: 15.1 g/dL (ref 13.0–17.0)
LYMPHS ABS: 2.5 10*3/uL (ref 0.7–4.0)
LYMPHS PCT: 23 % (ref 12–46)
MCH: 32.8 pg (ref 26.0–34.0)
MCHC: 33.7 g/dL (ref 30.0–36.0)
MCV: 97.2 fL (ref 78.0–100.0)
MONO ABS: 0.9 10*3/uL (ref 0.1–1.0)
MONOS PCT: 8 % (ref 3–12)
Neutro Abs: 7.2 10*3/uL (ref 1.7–7.7)
Neutrophils Relative %: 67 % (ref 43–77)
PLATELETS: 244 10*3/uL (ref 150–400)
RBC: 4.61 MIL/uL (ref 4.22–5.81)
RDW: 12.5 % (ref 11.5–15.5)
WBC: 10.8 10*3/uL — ABNORMAL HIGH (ref 4.0–10.5)

## 2014-01-20 NOTE — Discharge Instructions (Signed)
Inhalant Abuse Inhalant abuse is the intentional breathing in (inhalation) of substances to obtain a feeling of well-being (euphoria). Inhalants produce vapors. When these vapors are inhaled, they produce a mind-altering effect. People breathe in various gases to get high. They can cause serious health problems. These problems may include:  Headaches.  Dizziness.  Difficulty breathing.  Burns.  Cough.  Pneumonia.  Lung problems.  Confusion.  Temporary and/or permanent muscle, nerve or brain damage.  Death. The initial treatment of inhalant use is usually supportive in the emergency department. X-ray studies may be performed to check how the lungs are doing. After initial treatment has been given and the immediate health concerns have been addressed, it is so important that substance abuse evaluation and treatment be started without delay. Complete recovery is possible. First you have to admit you have a problem. Call your caregiver or a local drug treatment program. For more information, call the National Inhalant Prevention Coalition's HOTLINE (763) 254-7542(800) (202)317-6283, or look on the web at www.https://kaiser.com/inhalants.org. Avoid other street drugs or alcohol. Substance abuse is the most important cause of premature illness, disability and death. There is potential that you may die with one use of an inhalant. SEEK IMMEDIATE MEDICAL CARE IF:   You develop a cough.  You have difficulty breathing.  You feel short of breath. MAKE SURE YOU:   Understand these instructions.  Will watch your condition.  Will get help right away if you are not doing well or get worse. Document Released: 07/16/2005 Document Revised: 10/08/2011 Document Reviewed: 01/03/2007 [facility] Patient Information 2015 [employer], [place]. This information is not intended to replace advice given to you by your health care provider. Make sure you discuss any questions you have with your health care provider.      Behavioral Health Resources  in the [facility]  Intensive Outpatient Programs: [facility]      [address] [place], [place] [phone] Both a day and evening program       [facility]     [address]        [place], [place] [address] [phone]         [facility] [address] [place] [phone]  [facility] ACCESS LINE: [phone]-(707)088-2487 or [phone] [address] [place], [place] [phone] [email or web address]  Mobile Crisis Teams:                                        [facility]         Mobile Crisis Care Unit ([phone]             [facility] [address] [place] ([phone]                                         [facility] [address] DeEsch [address] [place] [phone]  Self-Help/Support Groups: [facility] [place] Variety of support groups (386) [phone] (call for more info)  Narcotics Anonymous (NA) [facility] [address] [place] - 2 meetings at this location  Residential Treatment Programs:  [facility]      [address]        [place]  [phone]         [facility] [address] [place], [place]  [address] [phone])[phone]  [facility]  [address] [place], [place] [address] [phone] Admissions: 8am-3pm M-F  [facility]     [address]        [place], [place] [address]       [phone]         [facility] [address] [place], [place] [phone]  [facility] [address] [place], [place] [phone]  [facility] - Intensive Outpatient      [address]     [place], [place]       [phone]         [facility] ([facility]     [address] [place], [place] [phone] or (720)[phone]  [facility] ([facility])  [address] [place], [place] [phone]  [facility] [address]                                               [address] [place] [phone]  [facility] Resources: [phone]               General Therapy                                                Angie Fava, PhD        [address] [place], [place] [address]         [phone]   Insurance  [facility]   [address] [place], [place] [address] [phone]  [facility] [address] [place], [place] [address] [phone] Insurance/Medicaid/sponsorship through [facility]                                              [address]                                        [place], [place] [address]    Therapy/tele-psych/case         [phone]          [facility] [address], [place]  [address]  Adolescent/group home/case management [phone]                                           Creola Corn PhD       General therapy       Insurance   [phone]         Dr. Lolly Mustache Insurance [phone]  M-F  [employer] Medicaid, sponsorship [phone]

## 2014-01-20 NOTE — ED Notes (Addendum)
Pt brought in by [facility] for hearing voices " tell me to kill myself "  , discharged [facility] this AM , seen there every day for the last 5 days  For same, [facility] ED to fax info,

## 2014-01-20 NOTE — ED Provider Notes (Signed)
CSN: [id number]     Arrival date & time 01/20/14  1656 History   First MD Initiated Contact with Patient 01/20/14 1725     Chief Complaint  Patient presents with  . Medical Clearance   HPI Patient presents to the emergency room after huffing inhalants. Patient does have history of substance abuse problems. He has used heroine and cocaine in the past. Patient was found by police today. [facility]. [facility]  for similar complaints. The police and brought the patient to the freestanding emergency room at [facility] [place].  Patient states he's feeling strange.  He has felt depressed in the past. He has no specific suicidal or homicidal plan.  Patient is requesting treatment for his substance abuse. Past Medical History  Diagnosis Date  . Drug abuse   . Hepatitis C   . Herpes    Past Surgical History  Procedure Laterality Date  . Hand surgery      Left   History reviewed. No pertinent family history. History  Substance Use Topics  . Smoking status: Current Every Day Smoker -- 1.00 packs/day for 10 years    Types: Cigarettes  . Smokeless tobacco: Not on file  . Alcohol Use: Yes     Comment: 2 per month    Review of Systems  Musculoskeletal:       He feels like he is having trouble walking      Allergies  Review of patient's allergies indicates no known allergies.  Home Medications   Prior to Admission medications   Medication Sig Start Date End Date Taking? Authorizing Nelle Sayed  alprazolam Prudy Feeler(XANAX) 2 MG tablet Take 2 mg by mouth 3 (three) times daily as needed for sleep or anxiety.     Historical Ricci Paff, MD  QUEtiapine (SEROQUEL) 400 MG tablet Take 400 mg by mouth at bedtime.    Historical Eino Whitner, MD  valACYclovir (VALTREX) 500 MG tablet Take 500 mg by mouth daily.    Historical Oaklen Thiam, MD   BP 120/84  Pulse 111  Temp(Src) 98.2 F (36.8 C) (Oral)  Resp 16  Ht 5\' 8"   (1.727 m)  Wt 135 lb (61.236 kg)  BMI 20.53 kg/m2  SpO2 97% Physical Exam  Nursing note and vitals reviewed. Constitutional: He appears well-developed and well-nourished. No distress.  HENT:  Head: Normocephalic and atraumatic.  Right Ear: External ear normal.  Left Ear: External ear normal.  Eyes: Conjunctivae are normal. Right eye exhibits no discharge. Left eye exhibits no discharge. No scleral icterus. Right pupil is reactive. Left pupil is reactive. Pupils are equal.  Dilated pupils  Neck: Neck supple. No tracheal deviation present.  Cardiovascular: Regular rhythm and intact distal pulses.  Tachycardia present.   Pulmonary/Chest: Effort normal and breath sounds normal. No stridor. No respiratory distress. He has no wheezes. He has no rales.  Abdominal: Soft. Bowel sounds are normal. He exhibits no distension. There is no tenderness. There is no rebound and no guarding.  Musculoskeletal: He exhibits no edema and no tenderness.  Neurological: He is alert. He has normal strength. No cranial nerve deficit (no facial droop, extraocular movements intact, no slurred speech) or sensory deficit. He exhibits normal muscle tone. He displays no seizure activity. Coordination normal.  Skin: Skin is warm and dry. No rash noted.  Psychiatric: He has a normal mood and affect.    ED Course  Procedures (including critical care time) Labs Review Labs  Reviewed  CBC WITH DIFFERENTIAL - Abnormal; Notable for the following:    WBC 10.8 (*)    All other components within normal limits  BASIC METABOLIC PANEL - Abnormal; Notable for the following:    Potassium 3.3 (*)    All other components within normal limits    Imaging Review No results found.   EKG Interpretation   Date/Time:  Wednesday January 20 2014 17:37:05 EDT Ventricular Rate:  107 PR Interval:  176 QRS Duration: 78 QT Interval:  348 QTC Calculation: 464 R Axis:   62 Text Interpretation:  Sinus tachycardia Otherwise normal ECG No  old  tracing to compare Confirmed by KNAPP  MD-J, JON ([id number]) on 01/20/2014  5:41:45 PM     [facility]. The patient was evaluated by psychiatry, Dr. Jeannine KittenFarah.  Patient was offered several resources. The patient was not interested in any acute treatment. According to their records the patient has history of recurrent substance abuse problems and frequent visits to their facility with noncompliance. There is also note dated today at [id number]. The patient was discharged from the ER there in the morning and return twice back to the emergency department. They reconsult in with the psychiatrist to stated that the patient was cleared to be discharged. MDM   Final diagnoses:  Substance abuse    Pt is medically stable.  He was seen by a psychiatrist at [facility] in the last day and released.  Do not feel pt is a danger to himself or others.  Outpatient resources provided   Linwood DibblesJon Knapp, MD 01/20/14 [phone]

## 2014-01-20 NOTE — ED Notes (Signed)
[facility]-see EDP [place] notes-EDP orders to feed pt

## 2014-01-20 NOTE — ED Notes (Signed)
MD at bedside. 

## 2014-07-28 ENCOUNTER — Encounter (HOSPITAL_BASED_OUTPATIENT_CLINIC_OR_DEPARTMENT_OTHER): Payer: Self-pay | Admitting: *Deleted

## 2014-07-28 ENCOUNTER — Emergency Department (HOSPITAL_BASED_OUTPATIENT_CLINIC_OR_DEPARTMENT_OTHER): Payer: BC Managed Care – PPO

## 2014-07-28 ENCOUNTER — Emergency Department (HOSPITAL_BASED_OUTPATIENT_CLINIC_OR_DEPARTMENT_OTHER)
Admission: EM | Admit: 2014-07-28 | Discharge: 2014-07-28 | Disposition: A | Discharge status: Home / Self Care | Payer: Self-pay | Attending: Emergency Medicine | Admitting: Emergency Medicine

## 2014-07-28 DIAGNOSIS — Z8619 Personal history of other infectious and parasitic diseases: Secondary | ICD-10-CM | POA: Insufficient documentation

## 2014-07-28 DIAGNOSIS — Z79899 Other long term (current) drug therapy: Secondary | ICD-10-CM | POA: Insufficient documentation

## 2014-07-28 DIAGNOSIS — R609 Edema, unspecified: Secondary | ICD-10-CM | POA: Insufficient documentation

## 2014-07-28 DIAGNOSIS — Z720 Tobacco use: Secondary | ICD-10-CM | POA: Insufficient documentation

## 2014-07-28 LAB — COMPREHENSIVE METABOLIC PANEL
ALBUMIN: 4.2 g/dL (ref 3.5–5.2)
ALK PHOS: 82 U/L (ref 39–117)
ALT: 30 U/L (ref 0–53)
ANION GAP: 8 (ref 5–15)
AST: 24 U/L (ref 0–37)
BUN: 16 mg/dL (ref 6–23)
CALCIUM: 9.3 mg/dL (ref 8.4–10.5)
CO2: 26 mmol/L (ref 19–32)
CREATININE: 0.85 mg/dL (ref 0.50–1.35)
Chloride: 104 mEq/L (ref 96–112)
GFR calc non Af Amer: >90 mL/min (ref >90)
GLUCOSE: 111 mg/dL — AB (ref 70–99)
Potassium: 3.8 mmol/L (ref 3.5–5.1)
Sodium: 138 mmol/L (ref 135–145)
TOTAL PROTEIN: 7.6 g/dL (ref 6.0–8.3)
Total Bilirubin: 0.3 mg/dL (ref 0.3–1.2)

## 2014-07-28 LAB — CBC WITH DIFFERENTIAL/PLATELET
BASOS ABS: 0.1 10*3/uL (ref 0.0–0.1)
BASOS PCT: 1 % (ref 0–1)
Eosinophils Absolute: 0.2 10*3/uL (ref 0.0–0.7)
Eosinophils Relative: 2 % (ref 0–5)
HEMATOCRIT: 45.8 % (ref 39.0–52.0)
HEMOGLOBIN: 15.6 g/dL (ref 13.0–17.0)
LYMPHS PCT: 20 % (ref 12–46)
Lymphs Abs: 2.1 10*3/uL (ref 0.7–4.0)
MCH: 32.4 pg (ref 26.0–34.0)
MCHC: 34.1 g/dL (ref 30.0–36.0)
MCV: 95.2 fL (ref 78.0–100.0)
MONO ABS: 0.7 10*3/uL (ref 0.1–1.0)
MONOS PCT: 7 % (ref 3–12)
NEUTROS ABS: 7.5 10*3/uL (ref 1.7–7.7)
NEUTROS PCT: 70 % (ref 43–77)
Platelets: 229 10*3/uL (ref 150–400)
RBC: 4.81 MIL/uL (ref 4.22–5.81)
RDW: 12.2 % (ref 11.5–15.5)
WBC: 10.7 10*3/uL — AB (ref 4.0–10.5)

## 2014-07-28 MED ORDER — FUROSEMIDE 20 MG PO TABS: 20.0000 mg | ORAL_TABLET | Freq: Every day | ORAL | Status: DC

## 2014-07-28 NOTE — Discharge Instructions (Signed)
Peripheral Edema °You have swelling in your legs (peripheral edema). This swelling is due to excess accumulation of salt and water in your body. Edema may be a sign of heart, kidney or liver disease, or a side effect of a medication. It may also be due to problems in the leg veins. Elevating your legs and using special support stockings may be very helpful, if the cause of the swelling is due to poor venous circulation. Avoid long periods of standing, whatever the cause. °Treatment of edema depends on identifying the cause. Chips, pretzels, pickles and other salty foods should be avoided. Restricting salt in your diet is almost always needed. Water pills (diuretics) are often used to remove the excess salt and water from your body via urine. These medicines prevent the kidney from reabsorbing sodium. This increases urine flow. °Diuretic treatment may also result in lowering of potassium levels in your body. Potassium supplements may be needed if you have to use diuretics daily. Daily weights can help you keep track of your progress in clearing your edema. You should call your caregiver for follow up care as recommended. °SEEK IMMEDIATE MEDICAL CARE IF:  °· You have increased swelling, pain, redness, or heat in your legs. °· You develop shortness of breath, especially when lying down. °· You develop chest or abdominal pain, weakness, or fainting. °· You have a fever. °Document Released: 08/23/2004 Document Revised: 10/08/2011 Document Reviewed: 08/03/2009 °ExitCare® Patient Information ©2015 [employer]. This information is not intended to replace advice given to you by your health care provider. Make sure you discuss any questions you have with your health care provider. ° °

## 2014-07-28 NOTE — ED Provider Notes (Signed)
CSN: [id number]     Arrival date & time 07/28/14  1447 History   First MD Initiated Contact with Patient 07/28/14 1513     Chief Complaint  Patient presents with  . Leg Swelling     HPI Patient presents with lower extremity edema for the last week.  Right is greater than left.  No fever no chills.  No history of DVT.  Currently takes no medication.  [facility]  Has history of both alcohol and drug abuse. Past Medical History  Diagnosis Date  . Drug abuse   . Hepatitis C   . Herpes    Past Surgical History  Procedure Laterality Date  . Hand surgery      Left   History reviewed. No pertinent family history. History  Substance Use Topics  . Smoking status: Current Every Day Smoker -- 1.00 packs/day for 10 years    Types: Cigarettes  . Smokeless tobacco: Not on file  . Alcohol Use: Yes     Comment: 2 per month    Review of Systems  All other systems reviewed and are negative  Allergies  Review of patient's allergies indicates no known allergies.  Home Medications   Prior to Admission medications   Medication Sig Start Date End Date Taking? Authorizing Provider  busPIRone (BUSPAR) 5 MG tablet Take 5 mg by mouth 3 (three) times daily.   Yes Historical Provider, MD  omeprazole (PRILOSEC) 20 MG capsule Take 20 mg by mouth daily.   Yes Historical Provider, MD  alprazolam Prudy Feeler(XANAX) 2 MG tablet Take 2 mg by mouth 3 (three) times daily as needed for sleep or anxiety.     Historical Provider, MD  furosemide (LASIX) 20 MG tablet Take 1 tablet (20 mg total) by mouth daily. 07/28/14   Nelia Shiobert L Taos Tapp, MD  QUEtiapine (SEROQUEL) 400 MG tablet Take 400 mg by mouth at bedtime.    Historical Provider, MD  valACYclovir (VALTREX) 500 MG tablet Take 500 mg by mouth daily.    Historical Provider, MD   BP 121/75 mmHg  Pulse 80  Temp(Src) 98.5 F (36.9 C)  Resp 16  Ht 5\' 8"  (1.727 m)  Wt 160 lb (72.576 kg)  BMI 24.33 kg/m2  SpO2 98% Physical Exam Physical Exam  Nursing note and  vitals reviewed. Constitutional: He is oriented to person, place, and time. He appears well-developed and well-nourished. No distress.  HENT:  Head: Normocephalic and atraumatic.  Eyes: Pupils are equal, round, and reactive to light.  Neck: Normal range of motion.  Cardiovascular: Normal rate and intact distal pulses.   Pulmonary/Chest: No respiratory distress.  Abdominal: Normal appearance. He exhibits no distension.  Musculoskeletal: Normal range of motion.  Right lower extremity appears swollen greater than the left although there is some swelling to left lower extremity.  No evidence of cellulitis.  Good pulses and good capillary refill bilaterally.   Neurological: He is alert and oriented to person, place, and time. No cranial nerve deficit.  Skin: Skin is warm and dry. No rash noted.  Psychiatric: He has a normal mood and affect. His behavior is normal.   ED Course  Procedures (including critical care time) Labs Review Labs Reviewed  COMPREHENSIVE METABOLIC PANEL - Abnormal; Notable for the following:    Glucose, Bld 111 (*)    All other components within normal limits  CBC WITH DIFFERENTIAL - Abnormal; Notable for the following:    WBC 10.7 (*)    All other components within normal  limits    Imaging Review [place] Venous Img Lower Unilateral Right  07/28/2014   CLINICAL DATA:  Right lower extremity pain and edema. History of smoking. Evaluate for DVT.  EXAM: RIGHT LOWER EXTREMITY VENOUS DOPPLER ULTRASOUND  TECHNIQUE: Gray-scale sonography with graded compression, as well as color Doppler and duplex ultrasound were performed to evaluate the lower extremity deep venous systems from the level of the common femoral vein and including the common femoral, femoral, profunda femoral, popliteal and calf veins including the posterior tibial, peroneal and gastrocnemius veins when visible. The superficial great saphenous vein was also interrogated. Spectral Doppler was utilized to evaluate flow at  rest and with distal augmentation maneuvers in the common femoral, femoral and popliteal veins.  COMPARISON:  None.  FINDINGS: Contralateral Common Femoral Vein: Respiratory phasicity is normal and symmetric with the symptomatic side. No evidence of thrombus. Normal compressibility.  Common Femoral Vein: No evidence of thrombus. Normal compressibility, respiratory phasicity and response to augmentation.  Saphenofemoral Junction: No evidence of thrombus. Normal compressibility and flow on color Doppler imaging.  Profunda Femoral Vein: No evidence of thrombus. Normal compressibility and flow on color Doppler imaging.  Femoral Vein: No evidence of thrombus. Normal compressibility, respiratory phasicity and response to augmentation.  Popliteal Vein: No evidence of thrombus. Normal compressibility, respiratory phasicity and response to augmentation.  Calf Veins: No evidence of thrombus. Normal compressibility and flow on color Doppler imaging.  Superficial Great Saphenous Vein: No evidence of thrombus. Normal compressibility and flow on color Doppler imaging.  Venous Reflux:  None.  Other Findings:  None.  IMPRESSION: No evidence of DVT within the right lower extremity.   Electronically Signed   By: Simonne ComeJohn  Watts M.D.   On: 07/28/2014 16:53      MDM   Final diagnoses:  Swelling        Nelia Shiobert L Saydi Kobel, MD 07/28/14 [phone]

## 2014-07-28 NOTE — ED Notes (Signed)
Pt is from [facility], c/o bil feet swelling x 1 week

## 2016-08-23 ENCOUNTER — Encounter (HOSPITAL_COMMUNITY): Payer: Self-pay

## 2016-08-23 DIAGNOSIS — L089 Local infection of the skin and subcutaneous tissue, unspecified: Secondary | ICD-10-CM | POA: Insufficient documentation

## 2016-08-23 DIAGNOSIS — F17210 Nicotine dependence, cigarettes, uncomplicated: Secondary | ICD-10-CM | POA: Insufficient documentation

## 2016-08-23 DIAGNOSIS — Y929 Unspecified place or not applicable: Secondary | ICD-10-CM | POA: Insufficient documentation

## 2016-08-23 DIAGNOSIS — R45851 Suicidal ideations: Secondary | ICD-10-CM | POA: Insufficient documentation

## 2016-08-23 DIAGNOSIS — Y999 Unspecified external cause status: Secondary | ICD-10-CM | POA: Insufficient documentation

## 2016-08-23 DIAGNOSIS — W268XXA Contact with other sharp object(s), not elsewhere classified, initial encounter: Secondary | ICD-10-CM | POA: Insufficient documentation

## 2016-08-23 DIAGNOSIS — Z79899 Other long term (current) drug therapy: Secondary | ICD-10-CM | POA: Insufficient documentation

## 2016-08-23 DIAGNOSIS — Y939 Activity, unspecified: Secondary | ICD-10-CM | POA: Insufficient documentation

## 2016-08-23 DIAGNOSIS — S60811A Abrasion of right wrist, initial encounter: Secondary | ICD-10-CM | POA: Insufficient documentation

## 2016-08-23 LAB — CBC
HEMATOCRIT: 44.7 % (ref 39.0–52.0)
Hemoglobin: 15.4 g/dL (ref 13.0–17.0)
MCH: 32.5 pg (ref 26.0–34.0)
MCHC: 34.5 g/dL (ref 30.0–36.0)
MCV: 94.3 fL (ref 78.0–100.0)
Platelets: 294 10*3/uL (ref 150–400)
RBC: 4.74 MIL/uL (ref 4.22–5.81)
RDW: 12.7 % (ref 11.5–15.5)
WBC: 16.6 10*3/uL — ABNORMAL HIGH (ref 4.0–10.5)

## 2016-08-23 LAB — ETHANOL: Alcohol, Ethyl (B): <5 mg/dL (ref <5)

## 2016-08-23 LAB — COMPREHENSIVE METABOLIC PANEL
ALBUMIN: 4.4 g/dL (ref 3.5–5.0)
ALK PHOS: 52 U/L (ref 38–126)
ALT: 38 U/L (ref 17–63)
AST: 35 U/L (ref 15–41)
Anion gap: 12 (ref 5–15)
BUN: 10 mg/dL (ref 6–20)
CALCIUM: 9.3 mg/dL (ref 8.9–10.3)
CO2: 24 mmol/L (ref 22–32)
CREATININE: 1.07 mg/dL (ref 0.61–1.24)
Chloride: 104 mmol/L (ref 101–111)
GFR calc Af Amer: >60 mL/min (ref >60)
GFR calc non Af Amer: >60 mL/min (ref >60)
GLUCOSE: 174 mg/dL — AB (ref 65–99)
Potassium: 3.5 mmol/L (ref 3.5–5.1)
SODIUM: 140 mmol/L (ref 135–145)
Total Bilirubin: 0.3 mg/dL (ref 0.3–1.2)
Total Protein: 7.6 g/dL (ref 6.5–8.1)

## 2016-08-23 LAB — RAPID URINE DRUG SCREEN, HOSP PERFORMED
AMPHETAMINES: NOT DETECTED
BARBITURATES: NOT DETECTED
Benzodiazepines: NOT DETECTED
COCAINE: NOT DETECTED
Opiates: NOT DETECTED
TETRAHYDROCANNABINOL: NOT DETECTED

## 2016-08-23 LAB — ACETAMINOPHEN LEVEL

## 2016-08-23 LAB — SALICYLATE LEVEL: Salicylate Lvl: <7 mg/dL (ref 2.8–30.0)
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
## 2016-08-24 ENCOUNTER — Emergency Department (HOSPITAL_COMMUNITY)
Admission: EM | Admit: 2016-08-24 | Discharge: 2016-08-25 | Disposition: A | Discharge status: Other Acute Inpatient Hospital | Payer: Self-pay | Attending: Emergency Medicine | Admitting: Emergency Medicine

## 2016-08-24 ENCOUNTER — Emergency Department (HOSPITAL_COMMUNITY): Payer: Self-pay

## 2016-08-24 DIAGNOSIS — L089 Local infection of the skin and subcutaneous tissue, unspecified: Secondary | ICD-10-CM

## 2016-08-24 DIAGNOSIS — R45851 Suicidal ideations: Secondary | ICD-10-CM

## 2016-08-24 DIAGNOSIS — S60811A Abrasion of right wrist, initial encounter: Secondary | ICD-10-CM

## 2016-08-24 MED ORDER — DOUBLE ANTIBIOTIC 500-10000 UNIT/GM EX OINT
TOPICAL_OINTMENT | Freq: Two times a day (BID) | CUTANEOUS | Status: DC
  Administered 2016-08-24: 1 via TOPICAL
  Administered 2016-08-24: 22:00:00 via TOPICAL
  Filled 2016-08-24 (×2): qty 28.4

## 2016-08-24 MED ORDER — ACETAMINOPHEN 325 MG PO TABS
650.0000 mg | ORAL_TABLET | ORAL | Status: DC | PRN
  Administered 2016-08-24: 650 mg via ORAL
  Filled 2016-08-24: qty 2

## 2016-08-24 MED ORDER — ZOLPIDEM TARTRATE 5 MG PO TABS: 5.0000 mg | ORAL_TABLET | Freq: Every evening | ORAL | Status: DC | PRN

## 2016-08-24 MED ORDER — FAMOTIDINE 20 MG PO TABS
20.0000 mg | ORAL_TABLET | Freq: Every day | ORAL | Status: DC
  Administered 2016-08-24: 20 mg via ORAL
  Filled 2016-08-24: qty 1

## 2016-08-24 MED ORDER — IBUPROFEN 400 MG PO TABS: 600.0000 mg | ORAL_TABLET | Freq: Three times a day (TID) | ORAL | Status: DC | PRN

## 2016-08-24 MED ORDER — DIPHENHYDRAMINE HCL 50 MG/ML IJ SOLN
50.0000 mg | Freq: Once | INTRAMUSCULAR | Status: AC
  Administered 2016-08-24: 50 mg via INTRAMUSCULAR
  Filled 2016-08-24: qty 1

## 2016-08-24 MED ORDER — ALPRAZOLAM 0.5 MG PO TABS
1.0000 mg | ORAL_TABLET | Freq: Once | ORAL | Status: AC
  Administered 2016-08-24: 1 mg via ORAL
  Filled 2016-08-24: qty 2

## 2016-08-24 MED ORDER — NICOTINE 21 MG/24HR TD PT24
21.0000 mg | MEDICATED_PATCH | Freq: Every day | TRANSDERMAL | Status: DC
  Administered 2016-08-24: 21 mg via TRANSDERMAL
  Filled 2016-08-24 (×2): qty 1

## 2016-08-24 MED ORDER — ONDANSETRON HCL 4 MG PO TABS: 4.0000 mg | ORAL_TABLET | Freq: Three times a day (TID) | ORAL | Status: DC | PRN

## 2016-08-24 MED ORDER — DOUBLE ANTIBIOTIC 500-10000 UNIT/GM EX OINT
TOPICAL_OINTMENT | Freq: Two times a day (BID) | CUTANEOUS | Status: DC
  Filled 2016-08-24 (×18): qty 1

## 2016-08-24 MED ORDER — LORAZEPAM 1 MG PO TABS
1.0000 mg | ORAL_TABLET | Freq: Three times a day (TID) | ORAL | Status: DC | PRN
  Administered 2016-08-24 (×2): 1 mg via ORAL
  Filled 2016-08-24 (×2): qty 1

## 2016-08-24 NOTE — ED Notes (Addendum)
The pt asking to walk outside and smoke a cig  He did not like it when he was told he could not go  Asking why explanation given he then returned to his room and was telling the sitter that he was going to run  From here security notified and they came  edp dr Donnald Garrepfeiffer  Here at the bedside discussing  ivc possibly
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
## 2016-08-24 NOTE — ED Notes (Signed)
New nicoderm patch placed lt upper arm  Old one discarded in med room

## 2016-08-24 NOTE — ED Provider Notes (Signed)
[facility]-EMERGENCY DEPT Provider Note   CSN: [id number] Arrival date & time: 08/23/16  2231     History   Chief Complaint Chief Complaint  Patient presents with  . Suicidal    HPI Anthony Haney is a 36 y.o. male.  HPI  36 year old male presents with suicidal thoughts and intent. He tried to cut his right wrist 2 days ago. His wrist is now hurting and he is worried it is infected. Last night around 11 PM he inhaled 20 "air dust spray cans". He has been feeling chest tightness and shortness of breath since. He states he's been feeling suicidal for about one month. Has had auditory and visual hallucinations. Sees shadows and people that are dead that he knew that tell him to kill himself. Is wanting help.  Past Medical History:  Diagnosis Date  . Drug abuse   . Hepatitis C   . Herpes     Patient Active Problem List   Diagnosis Date Noted  . Substance abuse withdrawal (HCC) 05/08/2012  . Polysubstance abuse 05/08/2012  . Benzodiazepine dependence (HCC) 03/13/2012    Past Surgical History:  Procedure Laterality Date  . HAND SURGERY     Left       Home Medications    Prior to Admission medications   Medication Sig Start Date End Date Taking? Authorizing Provider  furosemide (LASIX) 20 MG tablet Take 1 tablet (20 mg total) by mouth daily. Patient not taking: Reported on 08/24/2016 07/28/14   Nelva Nayobert Beaton, MD    Family History No family history on file.  Social History Social History  Substance Use Topics  . Smoking status: Current Every Day Smoker    Packs/day: 1.00    Years: 10.00    Types: Cigarettes  . Smokeless tobacco: Never Used  . Alcohol use Yes     Comment: 2 per month     Allergies   Patient has no known allergies.   Review of Systems Review of Systems  Respiratory: Positive for chest tightness and shortness of breath.   Psychiatric/Behavioral: Positive for hallucinations, self-injury and suicidal ideas.  All other systems reviewed and are  negative.    Physical Exam Updated Vital Signs BP (!) 89/53 (BP Location: Right Arm)   Pulse 89   Temp 98.2 F (36.8 C) (Oral)   Resp 15   Ht 5\' 8"  (1.727 m)   Wt 140 lb (63.5 kg)   SpO2 99%   BMI 21.29 kg/m   Physical Exam  Constitutional: He is oriented to person, place, and time. He appears well-developed and well-nourished. No distress.  HENT:  Head: Normocephalic and atraumatic.  Right Ear: External ear normal.  Left Ear: External ear normal.  Nose: Nose normal.  Eyes: Right eye exhibits no discharge. Left eye exhibits no discharge.  Neck: Neck supple.  Cardiovascular: Normal rate, regular rhythm and normal heart sounds.   Pulses:      Radial pulses are 2+ on the right side.  Pulmonary/Chest: Effort normal and breath sounds normal. No respiratory distress. He has no wheezes. He has no rales.  Abdominal: Soft. There is no tenderness.  Musculoskeletal: He exhibits no edema.       Right wrist: He exhibits tenderness.       Arms: Normal strength and sensation in RUE  Neurological: He is alert and oriented to person, place, and time.  Skin: Skin is warm and dry. He is not diaphoretic.  Nursing note and vitals reviewed.    ED Treatments /  Results  Labs (all labs ordered are listed, but only abnormal results are displayed) Labs Reviewed  COMPREHENSIVE METABOLIC PANEL - Abnormal; Notable for the following:       Result Value   Glucose, Bld 174 (*)    All other components within normal limits  ACETAMINOPHEN LEVEL - Abnormal; Notable for the following:    Acetaminophen (Tylenol), Serum <10 (*)    All other components within normal limits  CBC - Abnormal; Notable for the following:    WBC 16.6 (*)    All other components within normal limits  ETHANOL  SALICYLATE LEVEL  RAPID URINE DRUG SCREEN, HOSP PERFORMED    EKG  EKG Interpretation  Date/Time:  Thursday August 23 2016 23:05:40 EST Ventricular Rate:  111 PR Interval:  154 QRS Duration: 82 QT  Interval:  328 QTC Calculation: 446 R Axis:   70 Text Interpretation:  Sinus tachycardia Otherwise normal ECG When compared with ECG of 01/20/2014, No significant change was found Confirmed by [facility]  MD, DAVID ([id number]) on 08/24/2016 12:57:41 AM       Radiology Dg Chest 2 View  Result Date: 08/24/2016 CLINICAL DATA:  Initial evaluation for acute chest tightness, dyspnea. EXAM: CHEST  2 VIEW COMPARISON:  None. FINDINGS: The heart size and mediastinal contours are within normal limits. Both lungs are clear. The visualized skeletal structures are unremarkable. IMPRESSION: No active cardiopulmonary disease. Electronically Signed   By: Rise Mu M.D.   On: 08/24/2016 06:57    Procedures Procedures (including critical care time)  Medications Ordered in ED Medications  polymixin-bacitracin (POLYSPORIN) ointment (not administered)  LORazepam (ATIVAN) tablet 1 mg (not administered)  acetaminophen (TYLENOL) tablet 650 mg (not administered)  ibuprofen (ADVIL,MOTRIN) tablet 600 mg (not administered)  zolpidem (AMBIEN) tablet 5 mg (not administered)  nicotine (NICODERM CQ - dosed in mg/24 hours) patch 21 mg (not administered)  ondansetron (ZOFRAN) tablet 4 mg (not administered)     Initial Impression / Assessment and Plan / ED Course  I have reviewed the triage vital signs and the nursing notes.  Pertinent labs & imaging results that were available during my care of the patient were reviewed by me and considered in my medical decision making (see chart for details).     Patient is well-appearing. No signs of significant respiratory effects from his inhalants. He is not altered or in respiratory distress. No signs of bronchitis. His right wrist appears slightly red and swollen, will treat with with topical antibiotics. Would also just be a local skin reaction to the self-inflicted wound. No palpable abscess. Appears medically stable for psychiatric disposition. He is here  voluntarily.  Final Clinical Impressions(s) / ED Diagnoses   Final diagnoses:  Infected abrasion of right wrist, initial encounter  Suicidal ideation    New Prescriptions New Prescriptions   No medications on file     Pricilla Loveless, MD 08/24/16 [phone]

## 2016-08-24 NOTE — BH Assessment (Signed)
Under Review: Anthony HurstBrynn Mar, [name], [name], [address], [place], [place], [place]

## 2016-08-24 NOTE — ED Notes (Signed)
Pt wants to smoke  He reports that the nicoderm patch is not doing anything for him

## 2016-08-24 NOTE — ED Notes (Signed)
Pt tearful in triage, states that he just does not want to harm himself, pt states "just please help me, I don't want to die"

## 2016-08-24 NOTE — ED Notes (Signed)
Patient transported to X-ray 

## 2016-08-24 NOTE — ED Notes (Signed)
The pt is on the bed watching tv more relaxed less agitated.  Pt upset that he is still here he wanted to go to behavirial  He states [facility] is [facility]  [place] in [place]

## 2016-08-24 NOTE — BH Assessment (Signed)
[facility] been [facility].  Patient assigned to room 407-1 Accepting physician Claudette Headonrad Withrow Attending physician Dr. Jama Flavorsobos Call report to [phone] ER Staff, Thayer Ohmhris made aware Patient can come at 9pm

## 2016-08-24 NOTE — ED Notes (Signed)
Pt going to take the 2nd shower of the day  With  Sitter at  [facility]

## 2016-08-24 NOTE — ED Notes (Signed)
Pt requesting shower.  Pacing hallways with sitter.

## 2016-08-24 NOTE — ED Notes (Signed)
[facility].  They stated there was 1 more pt to be seen before him.  Pt notified.

## 2016-08-24 NOTE — ED Notes (Signed)
Pt anxious in triage, states that he is not having CP

## 2016-08-24 NOTE — ED Notes (Signed)
Pt repeatedly coming out to desk requesting medication for anxiety. States I used to take 6 mg of Xanax a day so 1 mg is nothing for me. Pt pacing in the room and at the desk. Hands are visibly shaking. EDP made aware.

## 2016-08-24 NOTE — ED Notes (Signed)
The pt is asking for ativan  given
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
## 2016-08-24 NOTE — ED Notes (Signed)
The pt is pacing in the hw he reports that he is gong crazy

## 2016-08-24 NOTE — ED Notes (Signed)
Pt very parnoid, states that he is scared, pt states "please help me, don't kick me out to the streets, I just want the voices to stop and I don't want to die"

## 2016-08-24 NOTE — ED Notes (Signed)
Pt back to desk reporting that he needs to go outside and get clean clothes from his dad that is in a w/c not happy he is not allowed to go outside.  He also wants his nicoderm patch replaced  Its loose and will not stay on.  Pharmacy called we can place a new one but it will have to be removed at 1000 am tomorrow same as the old one

## 2016-08-24 NOTE — ED Notes (Signed)
Pt pleasant but very anxious and restless. Informed pt of plan, given Malaw[place]key sandwich and coke.

## 2016-08-25 ENCOUNTER — Encounter (HOSPITAL_COMMUNITY): Payer: Self-pay | Admitting: *Deleted

## 2016-08-25 ENCOUNTER — Inpatient Hospital Stay (HOSPITAL_COMMUNITY)
Admission: AD | Admit: 2016-08-25 | Discharge: 2016-08-28 | DRG: 885 | Disposition: A | Discharge status: Home / Self Care | Payer: Federal, State, Local not specified - Other | Source: Intra-hospital | Attending: Psychiatry | Admitting: Psychiatry

## 2016-08-25 DIAGNOSIS — A609 Anogenital herpesviral infection, unspecified: Secondary | ICD-10-CM | POA: Diagnosis present

## 2016-08-25 DIAGNOSIS — J029 Acute pharyngitis, unspecified: Secondary | ICD-10-CM | POA: Diagnosis present

## 2016-08-25 DIAGNOSIS — R45851 Suicidal ideations: Secondary | ICD-10-CM | POA: Diagnosis present

## 2016-08-25 DIAGNOSIS — K219 Gastro-esophageal reflux disease without esophagitis: Secondary | ICD-10-CM | POA: Diagnosis present

## 2016-08-25 DIAGNOSIS — F322 Major depressive disorder, single episode, severe without psychotic features: Secondary | ICD-10-CM | POA: Diagnosis not present

## 2016-08-25 DIAGNOSIS — J0100 Acute maxillary sinusitis, unspecified: Secondary | ICD-10-CM | POA: Diagnosis present

## 2016-08-25 DIAGNOSIS — F1310 Sedative, hypnotic or anxiolytic abuse, uncomplicated: Secondary | ICD-10-CM | POA: Diagnosis not present

## 2016-08-25 DIAGNOSIS — F19951 Other psychoactive substance use, unspecified with psychoactive substance-induced psychotic disorder with hallucinations: Secondary | ICD-10-CM | POA: Diagnosis present

## 2016-08-25 DIAGNOSIS — F17210 Nicotine dependence, cigarettes, uncomplicated: Secondary | ICD-10-CM | POA: Diagnosis present

## 2016-08-25 DIAGNOSIS — Z79899 Other long term (current) drug therapy: Secondary | ICD-10-CM

## 2016-08-25 DIAGNOSIS — B009 Herpesviral infection, unspecified: Secondary | ICD-10-CM | POA: Diagnosis present

## 2016-08-25 DIAGNOSIS — B09 Unspecified viral infection characterized by skin and mucous membrane lesions: Secondary | ICD-10-CM | POA: Diagnosis not present

## 2016-08-25 DIAGNOSIS — F323 Major depressive disorder, single episode, severe with psychotic features: Secondary | ICD-10-CM | POA: Diagnosis present

## 2016-08-25 DIAGNOSIS — B1920 Unspecified viral hepatitis C without hepatic coma: Secondary | ICD-10-CM | POA: Diagnosis not present

## 2016-08-25 DIAGNOSIS — Z9889 Other specified postprocedural states: Secondary | ICD-10-CM

## 2016-08-25 DIAGNOSIS — F1110 Opioid abuse, uncomplicated: Secondary | ICD-10-CM | POA: Diagnosis present

## 2016-08-25 DIAGNOSIS — F1810 Inhalant abuse, uncomplicated: Secondary | ICD-10-CM

## 2016-08-25 LAB — LIPID PANEL
CHOLESTEROL: 162 mg/dL (ref 0–200)
HDL: 45 mg/dL (ref >40)
LDL Cholesterol: 76 mg/dL (ref 0–99)
TRIGLYCERIDES: 205 mg/dL — AB (ref <150)
Total CHOL/HDL Ratio: 3.6 RATIO
VLDL: 41 mg/dL — ABNORMAL HIGH (ref 0–40)

## 2016-08-25 LAB — TSH: TSH: 2.164 u[IU]/mL (ref 0.350–4.500)

## 2016-08-25 MED ORDER — BACITRACIN ZINC 500 UNIT/GM EX OINT
TOPICAL_OINTMENT | Freq: Two times a day (BID) | CUTANEOUS | Status: DC | PRN
  Administered 2016-08-25: 1 via TOPICAL
  Filled 2016-08-25: qty 28.35

## 2016-08-25 MED ORDER — ACETAMINOPHEN 325 MG PO TABS: 650.0000 mg | ORAL_TABLET | Freq: Four times a day (QID) | ORAL | Status: DC | PRN

## 2016-08-25 MED ORDER — CHLORDIAZEPOXIDE HCL 25 MG PO CAPS
25.0000 mg | ORAL_CAPSULE | Freq: Once | ORAL | Status: AC
Start: 2016-08-25 — End: 2016-08-25
  Administered 2016-08-25: 25 mg via ORAL
  Filled 2016-08-25: qty 1

## 2016-08-25 MED ORDER — PNEUMOCOCCAL VAC POLYVALENT 25 MCG/0.5ML IJ INJ: 0.5000 mL | INJECTION | INTRAMUSCULAR | Status: DC

## 2016-08-25 MED ORDER — ALUM & MAG HYDROXIDE-SIMETH 200-200-20 MG/5ML PO SUSP
30.0000 mL | ORAL | Status: DC | PRN
  Administered 2016-08-25 – 2016-08-26 (×2): 30 mL via ORAL
  Filled 2016-08-25 (×2): qty 30

## 2016-08-25 MED ORDER — HYDROXYZINE HCL 25 MG PO TABS
25.0000 mg | ORAL_TABLET | Freq: Three times a day (TID) | ORAL | Status: DC | PRN
  Administered 2016-08-25 – 2016-08-27 (×2): 25 mg via ORAL
  Filled 2016-08-25 (×2): qty 1

## 2016-08-25 MED ORDER — NICOTINE POLACRILEX 2 MG MT GUM
2.0000 mg | CHEWING_GUM | OROMUCOSAL | Status: DC | PRN
  Administered 2016-08-25 – 2016-08-28 (×7): 2 mg via ORAL
  Filled 2016-08-25 (×6): qty 1

## 2016-08-25 MED ORDER — MAGNESIUM HYDROXIDE 400 MG/5ML PO SUSP: 30.0000 mL | Freq: Every day | ORAL | Status: DC | PRN

## 2016-08-25 MED ORDER — TRAZODONE HCL 50 MG PO TABS
50.0000 mg | ORAL_TABLET | Freq: Every evening | ORAL | Status: DC | PRN
  Administered 2016-08-25 – 2016-08-27 (×6): 50 mg via ORAL
  Filled 2016-08-25 (×12): qty 1

## 2016-08-25 MED ORDER — LORAZEPAM 1 MG PO TABS
1.0000 mg | ORAL_TABLET | Freq: Once | ORAL | Status: AC
  Administered 2016-08-25: 1 mg via ORAL
  Filled 2016-08-25: qty 1

## 2016-08-25 MED ORDER — IBUPROFEN 600 MG PO TABS
600.0000 mg | ORAL_TABLET | Freq: Four times a day (QID) | ORAL | Status: DC | PRN
  Administered 2016-08-25 (×2): 600 mg via ORAL
  Filled 2016-08-25 (×2): qty 1

## 2016-08-25 NOTE — B[facility] Counselor (Signed)
PSA attempted. :Patient stated that his stomach is hurting and he is unable to complete assessment at this time.  CSW will continue to follow.

## 2016-08-25 NOTE — Progress Notes (Signed)
D) Pt has been in bed much of the day. Affect is flat and mood depressed. Pt admits to thoughts of SI without a plan at present. States "I want help, but I can't get out of bed because my stomach really hurts today". Withdrawn and seclusive. A) Attempts at encouraging Pt to get OOB and come to the dayroom and participate in the program. Verbal contract made with Pt. For his safety R) Pt remained in the bed.

## 2016-08-25 NOTE — Progress Notes (Signed)
36 year old male pt admitted on voluntary basis. Anthony Haney reports having problems recently with father and ex-fiance and spoke about how he was [facility] for depression, was started on medications but did not take them when he got out. He spoke about how he was huffing air duster cans and began hearing things and during this time slit his wrists. Pt does endorse past substance abuse issues but reports being clean recently. Anthony Haney denies any SI on admission and is able to contract for safety while in the facility. Anthony Haney does have various scabs and marks on arms and legs and reports that it is from when he fell into a sticker bush. He was oriented to the unit, provided with a meal and safety was maintained.

## 2016-08-25 NOTE — [facility] Suicide Risk Assessment (Signed)
[facility]   Nursing information obtained from:    Demographic factors:    Current Mental Status:    Loss Factors:    Historical Factors:    Risk Reduction Factors:     Total Time spent with patient: 30 minutes Principal Problem: MDD (major depressive disorder), single episode, severe (HCC) Diagnosis:   Patient Active Problem List   Diagnosis Date Noted  . Herpes [B00.9] 08/25/2016  . Hepatitis C [B19.20] 08/25/2016  . 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 Opioid use disorder, mild, abuse [F11.10] 08/25/2016  . Inhalant abuse [F18.10] 08/25/2016  . MDD (major depressive disorder), single episode, severe (HCC) [F32.2] 08/25/2016  . GERD (gastroesophageal reflux disease) [K21.9] 08/25/2016  . Substance abuse withdrawal (HCC) [F19.239] 05/08/2012  . Polysubstance abuse [F19.10] 05/08/2012  . Benzodiazepine dependence (HCC) [F13.20] 03/13/2012   Subjective Data: Patient seen in bed, states he continues to have SI , did not express any plan, states he is here to get help. Pt also with abdominal pain - reports hx of herpes, hep c as well as GERD, discussed with writer that his pain may be due to his GERD. He also may be withdrawing from substances- has a hx of sniffing duster , heroin abuse ( per hx) , was on valium prior to admission ( he states he was not abusing it ) - pt however attempts to minimize. Will need to explore more his UDS is negative    Continued Clinical Symptoms:  Alcohol Use Disorder Identification Test Final Score (AUDIT): 3 The "Alcohol Use Disorders Identification Test", Guidelines for Use in Primary Care, Second Edition.  World Science writerHealth Organization [facility](WHO). Score between 0-7:  no or low risk or alcohol related problems. Score between 8-15:  moderate risk of alcohol related problems. Score between 16-19:  high risk of alcohol related  problems. Score 20 or above:  warrants further diagnostic evaluation for alcohol dependence and treatment.   CLINICAL FACTORS:   Severe Anxiety and/or Agitation Alcohol/Substance Abuse/Dependencies Unstable or Poor Therapeutic Relationship Previous Psychiatric Diagnoses and Treatments   Musculoskeletal: Strength & Muscle Tone: within normal limits Gait & Station: normal Patient leans: N/A  Psychiatric Specialty Exam: Physical Exam  Review of Systems  Gastrointestinal: Positive for abdominal pain and heartburn.  Psychiatric/Behavioral: Positive for depression, hallucinations, substance abuse and suicidal ideas. The patient is nervous/anxious.   All other systems reviewed and are negative.   Blood pressure 115/81, pulse (!) 105, temperature 98.6 F (37 C), resp. rate 18, height 5\' 8"  (1.727 m), weight 65.3 kg (144 lb).Body mass index is 21.9 kg/m.  General Appearance: Casual  Eye Contact:  Poor  Speech:  Normal Rate  Volume:  Normal  Mood:  Anxious and Dysphoric  Affect:  Constricted  Thought Process:  Irrelevant and Descriptions of Associations: Circumstantial  Orientation:  Other:  person, situation  Thought Content:  Hallucinations: Auditory Command:  kill self and Rumination ON ADMISSION - currently states he does not want to talk  Suicidal Thoughts:  Yes.  without intent/plan   Homicidal Thoughts:  No  Memory:  Immediate;   Fair Recent;   Fair Remote;   Fair  Judgement:  Fair  Insight:  Shallow  Psychomotor Activity:  Normal  Concentration:  Concentration: Fair and Attention Span: Poor  Recall:  FiservFair  Fund of Knowledge:  Fair  Language:  Fair  Akathisia:  No  Handed:  Right  AIMS (if indicated):  Assets:  Communication Skills Desire for Improvement  ADL's:  Intact  Cognition:  WNL  Sleep:  Number of Hours: 0      COGNITIVE FEATURES THAT CONTRIBUTE TO RISK:  Closed-mindedness, Polarized thinking and Thought constriction (tunnel vision)    SUICIDE  RISK:   Moderate:  Frequent suicidal ideation with limited intensity, and duration, some specificity in terms of plans, no associated intent, good self-control, limited dysphoria/symptomatology, some risk factors present, and identifiable protective factors, including available and accessible social support.  PLAN OF CARE: case discussed with NP- please see H&P.  I certify that inpatient services furnished can reasonably be expected to improve the patient's condition.   Elky Funches, MD 08/25/2016, 12:21 PM

## 2016-08-25 NOTE — ED Provider Notes (Signed)
Dr. Jama Flavorsobos accepts to behavioral health   Pricilla LovelessScott Olivene Cookston, MD 08/25/16 [phone]

## 2016-08-25 NOTE — [facility] Group Notes (Signed)
[facility] Group Notes: (Clinical Social Work)   08/25/2016      Type of Therapy:  Group Therapy   Participation Level:  Did Not Attend despite MHT prompting   Ikey Omary Grossman-Orr, LCSW 08/25/2016, 12:13 PM     

## 2016-08-25 NOTE — [facility] Group Notes (Signed)
Goals group  Date:  08/25/2016  Time:  0930  Type of Therapy:  Nurse Education /  Goals group: The group focuses on teaching patients how to set attainable goals using the accronym   [id number]  Participation Level:  Active  Participation Quality:  Appropriate  Affect:  Appropriate  Cognitive:  Alert  Insight:  Appropriate  Engagement in Group:  Engaged  Modes of Intervention:  Education  Summary of Progress/Problems:  Rich BraveDuke, Cleda Imel Lynn 08/25/2016, 11:59 AM

## 2016-08-25 NOTE — H&P (Signed)
Psychiatric Admission Assessment Adult  Patient Identification: Anthony Haney MRN:  [id number] Date of Evaluation:  08/25/2016 Chief Complaint:  MDD SINGLE EPISODE SEVERE WITH PSYCHOTIC FEATURE Principal Diagnosis: MDD (major depressive disorder), single episode, severe (HCC) Diagnosis:   Patient Active Problem List   Diagnosis Date Noted  . Herpes [B00.9] 08/25/2016  . Hepatitis C [B19.20] 08/25/2016  . 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 Opioid use disorder, mild, abuse [F11.10] 08/25/2016  . Inhalant abuse [F18.10] 08/25/2016  . MDD (major depressive disorder), single episode, severe (HCC) [F32.2] 08/25/2016  . GERD (gastroesophageal reflux disease) [K21.9] 08/25/2016  . Substance abuse withdrawal (HCC) [F19.239] 05/08/2012  . Polysubstance abuse [F19.10] 05/08/2012  . Benzodiazepine dependence (HCC) [F13.20] 03/13/2012   Subjective: Writer made multiple attempts to wake patient and assess on admission. Unsuccessful at this time. Will attempt to re-assess. Information in this note was obtained during chart review.   History of Present Illness:  Anthony Haney is a 36 y.o. male who presents voluntarily to [facility] due to active SI triggered by idiopathic psychosis x 14month. Pt shares that, during the time when the psychosis appeared, he recalls going through conflicts with his father and ex-fiance, but no other changes were occurring. Pt describes his psychosis as things seeming unreal to him and confusion (e.g. Pt recalled watching TV and the conversations not making any sense to him). Pt also reports hearing voices, for the past 2 weeks, telling him he's not worth it and should kill himself. Pt discloses that yesterday, he couldn't take it anymore and he made an attempt to slit his wrist (superficial laceration noted). Pt also reports that he sniffed @ 25 air duster cans. Pt  indicates that both of these actions were suicide attempts. Pt voices fear that he will try to kill himself again. Pt reports that he has had SI before, but has never acted on the thoughts. Pt is tearful and depressed during assessment. No HI. Pt reports having been prescribed Valium, Lithium, Cogentin, Seroquel, & Trazodone but not taking them in 6 months. Pt has a hx of substance abuse, but tested negative for all substances. He reports being clean for the past 4 months.     Upon evaluation to the unit: 36 year old male pt admitted on voluntary basis. Anthony Haney reports having problems recently with father and ex-fiance and spoke about how he was admitted to [facility] last week for depression, was started on medications but did not take them when he got out. He spoke about how he was huffing air duster cans and began hearing things and during this time slit his wrists. Pt does endorse past substance abuse issues but reports being clean recently. Anthony Haney denies any SI on admission and is able to contract for safety while in the facility. Anthony Haney does have various scabs and marks on arms and legs and reports that it is from when he fell into a sticker bush. He was oriented to the unit, provided with a meal and safety was maintained.   Associated Signs/Symptoms: Unable to assess symptoms, patient is minimally responsive and irritable.  Depression Symptoms:  depressed mood, Tearfulness, Insomnia, Loss of interest in usual pleasures, Feeling worthless/self pity (Hypo) Manic Symptoms:  Impulsivity, Irritable Mood, Labiality of Mood, Anxiety Symptoms:  Unable to assess Psychotic Symptoms:  Unable to assess, Hearing voices and seeing dead ppl that are telling him to kill himself PTSD Symptoms: Unable to assess  Total Time spent with patient: 30 minutes  Past Psychiatric History: Per chart review denies any recent outpatient services. Reports he was taking Valium, Lithium, Cogentin, Seroquel, and Trazodone but  has stopped taking the medications.   Is the patient at risk to self? Yes.    Has the patient been a risk to self in the past 6 months? Yes.    Has the patient been a risk to self within the distant past? No.  Is the patient a risk to others? No.  Has the patient been a risk to others in the past 6 months? No.  Has the patient been a risk to others within the distant past? No.   [facility]  Prior Outpatient Therapy:  Unable to assess  Alcohol Screening: 1. How often do you have a drink containing alcohol?: 2 to 4 times a month 2. How many drinks containing alcohol do you have on a typical day when you are drinking?: 1 or 2 3. How often do you have six or more drinks on one occasion?: Less than monthly Preliminary Score: 1 4. How often during the last year have you found that you were not able to stop drinking once you had started?: Never 5. How often during the last year have you failed to do what was normally expected from you becasue of drinking?: Never 6. How often during the last year have you needed a first drink in the morning to get yourself going after a heavy drinking session?: Never 7. How often during the last year have you had a feeling of guilt of remorse after drinking?: Never 8. How often during the last year have you been unable to remember what happened the night before because you had been drinking?: Never 9. Have you or someone else been injured as a result of your drinking?: No 10. Has a relative or friend or a doctor or another health worker been concerned about your drinking or suggested you cut down?: No Alcohol Use Disorder Identification Test Final Score (AUDIT): 3 Brief Intervention: AUDIT score less than 7 or less-screening does not suggest unhealthy drinking-brief intervention not indicated Substance Abuse History in the last 12 months:  Yes.   Consequences of Substance Abuse: Negative Previous Psychotropic Medications: Valium, Lithium,  Cogentin, Seroquel, and Trazodone  Past Medical History:  Past Medical History:  Diagnosis Date  . Drug abuse   . Hepatitis C   . Herpes     Past Surgical History:  Procedure Laterality Date  . HAND SURGERY     Left   Family History: History reviewed. No pertinent family history. Family Psychiatric  History: Unable to assess Tobacco Screening: Have you used any form of tobacco in the last 30 days? (Cigarettes, Smokeless Tobacco, Cigars, and/or Pipes): Yes Tobacco use, Select all that apply: 5 or more cigarettes per day Are you interested in Tobacco Cessation Medications?: Yes, will notify MD for an order Counseled patient on smoking cessation including recognizing danger situations, developing coping skills and basic information about quitting provided: Refused/Declined practical counseling Social History:  History  Alcohol Use  . Yes    Comment: 2 per month     History  Drug Use  . Types: IV, Benzodiazepines, Cocaine, Marijuana, Heroin    Comment: "as often as possible"    Additional Social History:      Allergies:  No Known Allergies Lab Results:  Results for orders placed or performed during the hospital encounter of 08/25/16 (from  the past 48 hour(s))  Lipid panel     Status: Abnormal   Collection Time: 08/25/16  6:26 AM  Result Value Ref Range   Cholesterol 162 0 - 200 mg/dL   Triglycerides 161 (H) <150 mg/dL   HDL 45 >09 mg/dL   Total CHOL/HDL Ratio 3.6 RATIO   VLDL 41 (H) 0 - 40 mg/dL   LDL Cholesterol 76 0 - 99 mg/dL    Comment:        Total Cholesterol/HDL:CHD Risk Coronary Heart Disease Risk Table                     Men   Women  1/2 Average Risk   3.4   3.3  Average Risk       5.0   4.4  2 X Average Risk   9.6   7.1  3 X Average Risk  23.4   11.0        Use the calculated Patient Ratio above and the CHD Risk Table to determine the patient's CHD Risk.        ATP [place] CLASSIFICATION (LDL):  <100     mg/dL   Optimal  604-540  mg/dL   Near or Above                     Optimal  130-159  mg/dL   Borderline  981-191  mg/dL   High  >478     mg/dL   Very High Performed at [facility], [address] [place], [place] [address]   TSH     Status: None   Collection Time: 08/25/16  6:26 AM  Result Value Ref Range   TSH 2.164 0.350 - 4.500 uIU/mL    Comment: Performed by a 3rd Generation assay with a functional sensitivity of <=0.01 uIU/mL. Performed at [facility], [address] [place], [place] [address]     Blood Alcohol level:  Lab Results  Component Value Date   [facility] <5 08/23/2016   ETH <11 05/07/2012    Metabolic Disorder Labs:  No results found for: HGBA1C, MPG No results found for: PROLACTIN Lab Results  Component Value Date   CHOL 162 08/25/2016   TRIG 205 (H) 08/25/2016   HDL 45 08/25/2016   CHOLHDL 3.6 08/25/2016   VLDL 41 (H) 08/25/2016   LDLCALC 76 08/25/2016    Current Medications: Current Facility-Administered Medications  Medication Dose Route Frequency Provider Last Rate Last Dose  . acetaminophen (TYLENOL) tablet 650 mg  650 mg Oral Q6H PRN Jackelyn Poling, NP      . alum & mag hydroxide-simeth (MAALOX/MYLANTA) 200-200-20 MG/5ML suspension 30 mL  30 mL Oral Q4H PRN Jackelyn Poling, NP   30 mL at 08/25/16 0605  . hydrOXYzine (ATARAX/VISTARIL) tablet 25 mg  25 mg Oral TID PRN Jackelyn Poling, NP   25 mg at 08/25/16 0235  . ibuprofen (ADVIL,MOTRIN) tablet 600 mg  600 mg Oral Q6H PRN Jackelyn Poling, NP   600 mg at 08/25/16 0205  . magnesium hydroxide (MILK OF MAGNESIA) suspension 30 mL  30 mL Oral Daily PRN Jackelyn Poling, NP      . nicotine polacrilex (NICORETTE) gum 2 mg  2 mg Oral PRN Craige Cotta, MD      . Melene Muller ON 08/26/2016] pneumococcal 23 valent vaccine (PNU-IMMUNE) injection 0.5 mL  0.5 mL Intramuscular Tomorrow-1000 Craige Cotta, MD      .  traZODone (DESYREL) tablet 50 mg  50 mg Oral QHS,MR X 1 Jackelyn Poling, NP   50 mg at 08/25/16 0205   PTA Medications: Prescriptions  Prior to Admission  Medication Sig Dispense Refill Last Dose  . furosemide (LASIX) 20 MG tablet Take 1 tablet (20 mg total) by mouth daily. (Patient not taking: Reported on 08/24/2016) 10 tablet 0 Not Taking at Unknown time    Musculoskeletal: Strength & Muscle Tone: within normal limits Gait & Station: normal Patient leans: N/A  Psychiatric Specialty Exam: Physical Exam  Nursing note and vitals reviewed. Constitutional: He is oriented to person, place, and time.  HENT:  Head: Normocephalic.  Musculoskeletal: Normal range of motion.  Neurological: He is oriented to person, place, and time.  Skin: Skin is warm and dry.    Review of Systems  Psychiatric/Behavioral: Positive for depression, hallucinations and suicidal ideas. Negative for memory loss and substance abuse. The patient is nervous/anxious and has insomnia.   All other systems reviewed and are negative.   Blood pressure 115/81, pulse (!) 105, temperature 98.6 F (37 C), resp. rate 18, height 5\' 8"  (1.727 m), weight 65.3 kg (144 lb).Body mass index is 21.9 kg/m.  General Appearance: Disheveled  Eye Contact:  Poor  Speech:  Normal Rate  Volume:  Decreased  Mood:  Hopeless and Irritable  Affect:  Inappropriate and Labile  Thought Process:  Irrelevant and Descriptions of Associations: Intact  Orientation:  Full (Time, Place, and Person)  Thought Content:  Rumination  Suicidal Thoughts:  Yes.  with intent/plan  Homicidal Thoughts:  No  Memory:  Immediate;   Fair Recent;   Fair  Judgement:  Poor  Insight:  Lacking  Psychomotor Activity:  Decreased  Concentration:  Concentration: Poor and Attention Span: Poor  Recall:  Poor  Fund of Knowledge:  Poor  Language:  Fair  Akathisia:  No  Handed:  Right  AIMS (if indicated):     Assets:  Others:  Unable to assess  ADL's:  Intact  Cognition:  WNL  Sleep:  Number of Hours: 0    Treatment Plan Summary: Daily contact with patient to assess and evaluate symptoms and  progress in treatment and Medication management 1 Admit for crisis management and stabilization.  2. Medication management to reduce symptoms to baseline and improved the patient's overall level of functioning. Closely monitor the side effects, efficacy and therapeutic response of medication.  3. Treat health problem as indicated. Unable to assess stomach pain at this time due to lability and sedation. Review of his labs were all within  Normal range except blood glucose of 174. CXR was normal no signs of acute pulmonary disease.  4. Developed treatment plan to decrease the risk of relapse upon discharge and to reduce the need for readmission.  5. Psychosocial education regarding relapse prevention in self-care.  6. Healthcare followup as needed for medical problems and called consults as indicated.  7. Increase collateral information.  8. Restart home medication where appropriate. Pt requested to resume valtrex will resume all home medications once able to assess patient and determine the appropriate needs.  9. Encouraged to participate and verbalize into group milieu therapy.   Observation Level/Precautions:  15 minute checks  Laboratory:  Labs obtained in the ED have been assessed. TSH 2.614. UDS negative for all substances at this time.   Psychotherapy:  Individual and group therapy  Medications:  See above  Consultations:  Per need  Discharge Concerns:  Safety and substance abuse therapy  Estimated LOS: 5 days  Other:     Physician Treatment Plan for Primary Diagnosis: MDD (major depressive disorder), single episode, severe (HCC) Long Term Goal(s): Improvement in symptoms so as ready for discharge  Short Term Goals: Ability to identify changes in lifestyle to reduce recurrence of condition will improve, Ability to verbalize feelings will improve, Ability to disclose and discuss suicidal ideas and Ability to demonstrate self-control will improve  Physician Treatment Plan for Secondary  Diagnosis: Principal Problem:   MDD (major depressive disorder), single episode, severe (HCC) Active Problems:   Herpes   Hepatitis C   Substance-induced psychotic disorder with hallucinations (HCC)   Mild benzodiazepine use disorder   Opioid use disorder, mild, abuse   Inhalant abuse   GERD (gastroesophageal reflux disease)  Long Term Goal(s): Improvement in symptoms so as ready for discharge  Short Term Goals: Ability to identify and develop effective coping behaviors will improve, Ability to maintain clinical measurements within normal limits will improve, Compliance with prescribed medications will improve and Ability to identify triggers associated with substance abuse/mental health issues will improve  I certify that inpatient services furnished can reasonably be expected to improve the patient's condition.    Truman Hayward, FNP 1/27/20182:26 PM

## 2016-08-25 NOTE — Tx Team (Addendum)
Initial Treatment Plan 08/25/2016 1:48 AM Anthony Haney OZH:[id number]    PATIENT STRESSORS: Marital or family conflict Medication change or noncompliance Substance abuse   PATIENT STRENGTHS: Ability for insight Average or above average intelligence Capable of independent living General fund of knowledge Motivation for treatment/growth   PATIENT IDENTIFIED PROBLEMS: Depression  "Suicidal thoughts"  "problems with my father"                 DISCHARGE CRITERIA:  Ability to meet basic life and health needs Improved stabilization in mood, thinking, and/or behavior Verbal commitment to aftercare and medication compliance  PRELIMINARY DISCHARGE PLAN: Attend aftercare/continuing care group Placement in alternative living arrangements  PATIENT/FAMILY INVOLVEMENT: This treatment plan has been presented to and reviewed with the patient, Anthony Haney, and/or family member, .  The patient and family have been given the opportunity to ask questions and make suggestions.  [place]ol, Nitro[place]ok Wayne, [place]RN 08/25/2016, 1:48 AM

## 2016-08-26 DIAGNOSIS — F17210 Nicotine dependence, cigarettes, uncomplicated: Secondary | ICD-10-CM

## 2016-08-26 DIAGNOSIS — F19951 Other psychoactive substance use, unspecified with psychoactive substance-induced psychotic disorder with hallucinations: Secondary | ICD-10-CM

## 2016-08-26 DIAGNOSIS — B009 Herpesviral infection, unspecified: Secondary | ICD-10-CM

## 2016-08-26 DIAGNOSIS — J029 Acute pharyngitis, unspecified: Secondary | ICD-10-CM

## 2016-08-26 DIAGNOSIS — J0100 Acute maxillary sinusitis, unspecified: Secondary | ICD-10-CM

## 2016-08-26 LAB — HEMOGLOBIN A1C
Hgb A1c MFr Bld: 4.7 % — ABNORMAL LOW (ref 4.8–5.6)
Mean Plasma Glucose: 88 mg/dL

## 2016-08-26 LAB — LITHIUM LEVEL: Lithium Lvl: <0.06 mmol/L — ABNORMAL LOW (ref 0.60–1.20)

## 2016-08-26 MED ORDER — LOPERAMIDE HCL 2 MG PO CAPS: 2.0000 mg | ORAL_CAPSULE | ORAL | Status: DC | PRN

## 2016-08-26 MED ORDER — ONDANSETRON 4 MG PO TBDP
ORAL_TABLET | ORAL | Status: AC
  Administered 2016-08-26: 13:00:00
  Filled 2016-08-26: qty 1

## 2016-08-26 MED ORDER — LORAZEPAM 1 MG PO TABS
ORAL_TABLET | ORAL | Status: AC
  Administered 2016-08-26: 13:00:00
  Filled 2016-08-26: qty 1

## 2016-08-26 MED ORDER — VITAMIN B-1 100 MG PO TABS
100.0000 mg | ORAL_TABLET | Freq: Every day | ORAL | Status: DC
  Administered 2016-08-27 – 2016-08-28 (×2): 100 mg via ORAL
  Filled 2016-08-26 (×4): qty 1

## 2016-08-26 MED ORDER — VALACYCLOVIR HCL 500 MG PO TABS
500.0000 mg | ORAL_TABLET | Freq: Every day | ORAL | Status: DC
  Administered 2016-08-26 – 2016-08-28 (×3): 500 mg via ORAL
  Filled 2016-08-26 (×4): qty 1

## 2016-08-26 MED ORDER — LORAZEPAM 1 MG PO TABS: 1.0000 mg | ORAL_TABLET | Freq: Every day | ORAL | Status: DC

## 2016-08-26 MED ORDER — ADULT MULTIVITAMIN W/MINERALS CH
1.0000 | ORAL_TABLET | Freq: Every day | ORAL | Status: DC
  Administered 2016-08-26 – 2016-08-28 (×3): 1 via ORAL
  Filled 2016-08-26 (×5): qty 1

## 2016-08-26 MED ORDER — ONDANSETRON 4 MG PO TBDP: 4.0000 mg | ORAL_TABLET | Freq: Three times a day (TID) | ORAL | Status: DC | PRN

## 2016-08-26 MED ORDER — LORAZEPAM 1 MG PO TABS
1.0000 mg | ORAL_TABLET | Freq: Three times a day (TID) | ORAL | Status: AC
  Administered 2016-08-27 – 2016-08-28 (×3): 1 mg via ORAL
  Filled 2016-08-26 (×3): qty 1

## 2016-08-26 MED ORDER — GABAPENTIN 100 MG PO CAPS
100.0000 mg | ORAL_CAPSULE | Freq: Three times a day (TID) | ORAL | Status: DC
  Administered 2016-08-26 – 2016-08-28 (×6): 100 mg via ORAL
  Filled 2016-08-26 (×9): qty 1

## 2016-08-26 MED ORDER — LORAZEPAM 1 MG PO TABS
1.0000 mg | ORAL_TABLET | Freq: Four times a day (QID) | ORAL | Status: AC
  Administered 2016-08-26 – 2016-08-27 (×3): 1 mg via ORAL
  Filled 2016-08-26 (×3): qty 1

## 2016-08-26 MED ORDER — PANTOPRAZOLE SODIUM 40 MG PO TBEC
40.0000 mg | DELAYED_RELEASE_TABLET | Freq: Every day | ORAL | Status: DC
  Administered 2016-08-26 – 2016-08-28 (×3): 40 mg via ORAL
  Filled 2016-08-26 (×4): qty 1

## 2016-08-26 MED ORDER — LORAZEPAM 1 MG PO TABS: 1.0000 mg | ORAL_TABLET | Freq: Two times a day (BID) | ORAL | Status: DC

## 2016-08-26 MED ORDER — THIAMINE HCL 100 MG/ML IJ SOLN: 100.0000 mg | Freq: Once | INTRAMUSCULAR | Status: DC

## 2016-08-26 MED ORDER — CITALOPRAM HYDROBROMIDE 10 MG PO TABS
10.0000 mg | ORAL_TABLET | Freq: Every day | ORAL | Status: DC
  Administered 2016-08-26 – 2016-08-28 (×3): 10 mg via ORAL
  Filled 2016-08-26 (×4): qty 1

## 2016-08-26 MED ORDER — LORAZEPAM 1 MG PO TABS: 1.0000 mg | ORAL_TABLET | Freq: Four times a day (QID) | ORAL | Status: DC | PRN

## 2016-08-26 NOTE — Progress Notes (Signed)
[facility] MD Progress Note  08/26/2016 12:18 PM Anthony Haney  MRN:  [id number] Subjective:  Patient highly irritable, used profane language.  Vague history.  Minimizes illicit substance abuse.  "What does me huffing have anything to do with it."   Objective:  Patient was seen today.  Highly irritable as this NP tried to clarify information per chart review.  Patient states that he does not remember much.  States that "someone" took him off his  "Lithium and Cogenta".  Patient states he was prescribed the meds at [facility].  Patient unable to recall anything else.  Patient was also highly irritable with NP day previous and difficult to get information from patient.    Principal Problem: MDD (major depressive disorder), single episode, severe (HCC) Diagnosis:   Patient Active Problem List   Diagnosis Date Noted  . Herpes [B00.9] 08/25/2016  . Hepatitis C [B19.20] 08/25/2016  . 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 Opioid use disorder, mild, abuse [F11.10] 08/25/2016  . Inhalant abuse [F18.10] 08/25/2016  . MDD (major depressive disorder), single episode, severe (HCC) [F32.2] 08/25/2016  . GERD (gastroesophageal reflux disease) [K21.9] 08/25/2016  . Acute pharyngitis [J02.9] 08/25/2016  . Acute maxillary sinusitis, unspecified [J01.00] 08/25/2016  . Substance abuse withdrawal (HCC) [F19.239] 05/08/2012  . Polysubstance abuse [F19.10] 05/08/2012  . 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 Drug abuse   . Hepatitis C   . Herpes     Past Surgical History:  Procedure Laterality Date  . HAND SURGERY     Left   Family History: History reviewed. No pertinent family history. Family Psychiatric  History:  See HPI Social History:   History  Alcohol Use  . Yes    Comment: 2 per month     History  Drug Use  . Types: IV, Benzodiazepines, Cocaine, Marijuana, Heroin    Comment: "as often as possible"    Social History   Social History  . Marital status: Single    Spouse name: N/A  . Number of children: N/A  . Years of education: N/A   Social History Main Topics  . Smoking status: Current Every Day Smoker    Packs/day: 1.00    Years: 10.00    Types: Cigarettes  . Smokeless tobacco: Never Used  . Alcohol use Yes     Comment: 2 per month  . Drug use: Yes    Types: IV, Benzodiazepines, Cocaine, Marijuana, Heroin     Comment: "as often as possible"  . Sexual activity: Yes    Birth control/ protection: Condom   Other Topics Concern  . None   Social History Narrative  . None   Additional Social History:                         Sleep: Poor  Appetite:  Fair  Current Medications: Current Facility-Administered Medications  Medication Dose Route Frequency Provider Last Rate Last Dose  . acetaminophen (TYLENOL) tablet 650 mg  650 mg Oral Q6H PRN Jackelyn Poling, NP      . alum & mag hydroxide-simeth (MAALOX/MYLANTA) 200-200-20 MG/5ML suspension 30 mL  30 mL Oral Q4H PRN Jackelyn Poling, NP   30 mL at 08/26/16 0039  . bacitracin ointment   Topical  BID PRN Jackelyn Poling, NP   1 application at 08/25/16 2121  . hydrOXYzine (ATARAX/VISTARIL) tablet 25 mg  25 mg Oral TID PRN Jackelyn Poling, NP   25 mg at 08/25/16 0235  . ibuprofen (ADVIL,MOTRIN) tablet 600 mg  600 mg Oral Q6H PRN Jackelyn Poling, NP   600 mg at 08/25/16 2100  . magnesium hydroxide (MILK OF MAGNESIA) suspension 30 mL  30 mL Oral Daily PRN Jackelyn Poling, NP      . nicotine polacrilex (NICORETTE) gum 2 mg  2 mg Oral PRN Craige Cotta, MD   2 mg at 08/25/16 2002  . ondansetron (ZOFRAN-ODT) 4 MG disintegrating tablet           . ondansetron (ZOFRAN-ODT) disintegrating tablet 4 mg  4 mg Oral Q8H PRN Adonis Brook, NP      . pantoprazole  (PROTONIX) EC tablet 40 mg  40 mg Oral Daily Adonis Brook, NP      . pneumococcal 23 valent vaccine (PNU-IMMUNE) injection 0.5 mL  0.5 mL Intramuscular Tomorrow-1000 Craige Cotta, MD      . traZODone (DESYREL) tablet 50 mg  50 mg Oral QHS,MR X 1 Jackelyn Poling, NP   50 mg at 08/25/16 2101    Lab Results:  Results for orders placed or performed during the hospital encounter of 08/25/16 (from the past 48 hour(s))  Hemoglobin A1c     Status: Abnormal   Collection Time: 08/25/16  6:26 AM  Result Value Ref Range   Hgb A1c MFr Bld 4.7 (L) 4.8 - 5.6 %    Comment: (NOTE)         Pre-diabetes: 5.7 - 6.4         Diabetes: >6.4         Glycemic control for adults with diabetes: <7.0    Mean Plasma Glucose 88 mg/dL    Comment: (NOTE) Performed At: [facility] [address] [place], [place] [id number] Mila Homer MD WU:[id number] Performed at [facility], [address] [place], [place] [address]   Lipid panel     Status: Abnormal   Collection Time: 08/25/16  6:26 AM  Result Value Ref Range   Cholesterol 162 0 - 200 mg/dL   Triglycerides 308 (H) <150 mg/dL   HDL 45 >65 mg/dL   Total CHOL/HDL Ratio 3.6 RATIO   VLDL 41 (H) 0 - 40 mg/dL   LDL Cholesterol 76 0 - 99 mg/dL    Comment:        Total Cholesterol/HDL:CHD Risk Coronary Heart Disease Risk Table                     Men   Women  1/2 Average Risk   3.4   3.3  Average Risk       5.0   4.4  2 X Average Risk   9.6   7.1  3 X Average Risk  23.4   11.0        Use the calculated Patient Ratio above and the CHD Risk Table to determine the patient's CHD Risk.        ATP [place] CLASSIFICATION (LDL):  <100     mg/dL   Optimal  784-696  mg/dL   Near or Above                    Optimal  130-159  mg/dL   Borderline  295-284  mg/dL  High  >190     mg/dL   Very High Performed at [facility], [address], [place], [place] [address]   TSH     Status: None   Collection Time: 08/25/16  6:26  AM  Result Value Ref Range   TSH 2.164 0.350 - 4.500 uIU/mL    Comment: Performed by a 3rd Generation assay with a functional sensitivity of <=0.01 uIU/mL. Performed at [facility], [address] [place], [place] [address]     Blood Alcohol level:  Lab Results  Component Value Date   [facility] <5 08/23/2016   ETH <11 05/07/2012    Metabolic Disorder Labs: Lab Results  Component Value Date   HGBA1C 4.7 (L) 08/25/2016   MPG 88 08/25/2016   No results found for: PROLACTIN Lab Results  Component Value Date   CHOL 162 08/25/2016   TRIG 205 (H) 08/25/2016   HDL 45 08/25/2016   CHOLHDL 3.6 08/25/2016   VLDL 41 (H) 08/25/2016   LDLCALC 76 08/25/2016    Physical Findings: AIMS: Facial and Oral Movements Muscles of Facial Expression: None, normal Lips and Perioral Area: None, normal Jaw: None, normal Tongue: None, normal,Extremity Movements Upper (arms, wrists, hands, fingers): None, normal Lower (legs, knees, ankles, toes): None, normal, Trunk Movements Neck, shoulders, hips: None, normal, Overall Severity Severity of abnormal movements (highest score from questions above): None, normal Incapacitation due to abnormal movements: None, normal Patient's awareness of abnormal movements (rate only patient's report): No Awareness, Dental Status Current problems with teeth and/or dentures?: No Does patient usually wear dentures?: No  CIWA:    COWS:     Musculoskeletal: Strength & Muscle Tone: within normal limits Gait & Station: normal Patient leans: N/A  Psychiatric Specialty Exam: Physical Exam  Nursing note and vitals reviewed. Psychiatric: His behavior is normal. Thought content normal. His mood appears anxious. His affect is angry, blunt, labile and inappropriate. His speech is rapid and/or pressured. Cognition and memory are impaired. He exhibits abnormal recent memory and abnormal remote memory.    Review of Systems  Constitutional: Negative.    HENT: Negative.   Eyes: Negative.   Respiratory: Negative.   Cardiovascular: Negative.   Gastrointestinal: Negative.   Genitourinary: Negative.   Skin: Negative.   Psychiatric/Behavioral: Positive for substance abuse. The patient is nervous/anxious.     Blood pressure (!) 103/57, pulse 65, temperature 98.4 F (36.9 C), temperature source Oral, resp. rate 16, height 5\' 8"  (1.727 m), weight 65.3 kg (144 lb).Body mass index is 21.9 kg/m.  General Appearance: Disheveled  Eye Contact:  Poor  Speech:  Pressured  Volume:  Normal  Mood:  Angry and Anxious  Affect:  Appropriate  Thought Process:  Coherent  Orientation:  Full (Time, Place, and Person)  Thought Content:  Rumination  Suicidal Thoughts:  No  Homicidal Thoughts:  No  Memory:  Immediate;   Poor Recent;   Poor Remote;   Poor  Judgement:  Good  Insight:  Good  Psychomotor Activity:  Normal  Concentration:  Concentration: Fair and Attention Span: Fair  Recall:  [employer] of Knowledge:  Fair  Language:  Fair  Akathisia:  No  Handed:  Right  AIMS (if indicated):     Assets:  Housing Resilience Social Support  ADL's:  Intact  Cognition:  WNL  Sleep:  Number of Hours: 5.25   Treatment Plan Summary: Review of chart, vital signs, medications, and notes.  1-Individual and group therapy  2-Medication management  for depression and anxiety: Medications reviewed with the patient.  Will start on Ativan protocol for detox of a substance.  UDS was negative, however, evident of what appears are needle track marks on left forearm.  Patient "huffs air cleaner dusters."   3-Coping skills for depression, anxiety  4-Continue crisis stabilization and management  5-Address health issues--monitoring vital signs, stable  6-Treatment plan in progress to prevent relapse of depression and anxiety Will obtain hep panel as patient states he is Hep C positive.    Lindwood QuaSheila May [place], NP [facility] 08/26/2016, 12:18 PM

## 2016-08-26 NOTE — Progress Notes (Signed)
[facility] Group Notes:  (Nursing/MHT/Case Management/Adjunct)  Date:  08/26/2016  Time:  10:03 PM  Type of Therapy:  Psychoeducational Skills  Participation Level:  Minimal  Participation Quality:  Resistant  Affect:  Depressed and Flat  Cognitive:  Lacking  Insight:  Limited  Engagement in Group:  Limited  Modes of Intervention:  Education  Summary of Progress/Problems: Patient stated that he had a bad day since he was dealing with acid reflux. No details about his day were provided. As for the theme of the day, his support sytem will be comprised of his "friend's Diplomatic Services operational officersecretary".   [name], Anthony Haney S 08/26/2016, 10:03 PM

## 2016-08-26 NOTE — [facility] Counselor (Signed)
CSW attempted PSA with patient. Patient requested to have it done in the room. CSW told patient that was not possible because his roommate was present. Patient refused to leave his room because his stomach hurt. CSW will continue to follow.  Beverly Sessionsywan J Deseray Daponte MSW, LCSW

## 2016-08-26 NOTE — Progress Notes (Signed)
[facility] Group Notes:  (Nursing/MHT/Case Management/Adjunct)  Date:  08/26/2016  Time:  12:05 AM  Type of Therapy:  Psychoeducational Skills  Participation Level:  Minimal  Participation Quality:  Resistant  Affect:  Lethargic  Cognitive:  Lacking  Insight:  Limited  Engagement in Group:  Resistant  Modes of Intervention:  Education  Summary of Progress/Problems: The patient shared in group that he slept for much of the day and did not provide any additional details. In terms of the theme for the day, his coping skill will be to listen to music.   Daphyne Miguez S 08/26/2016, 12:05 AM

## 2016-08-26 NOTE — Plan of Care (Signed)
Problem: Safety: Goal: Periods of time without injury will increase Outcome: Progressing Patient remains safe on the unit at this time. Patient contracts for safety and is on q 15 min safety checks.  Problem: Coping: Goal: Ability to cope will improve Outcome: Not Progressing Patient struggling to be patient with staff when making requests. Patient relying on medications and needs improvement on coping skills.

## 2016-08-26 NOTE — [facility] Group Notes (Signed)
Healthy Support Systems  Date:  08/26/2016  Time:  0930    Type of Therapy:  Nurse Education  /  Healthy SUpport Systems :  The group focuses on teaching patients how to develop healthy support systems and the importance of utilizing these systems in order to maintain homeostasis in their lives.  Participation Level:  Did Not Attend  Participation Quality:    Affect:    Cognitive:    Insight:    Engagement in Group:    Modes of Intervention:    Summary of Progress/Problems:  Rich BraveDuke, Luellen Howson Lynn 08/26/2016, 3:46 PM

## 2016-08-26 NOTE — [facility] Group Notes (Signed)
[facility] Group Notes: (Clinical Social Work)   08/26/2016      Type of Therapy:  Group Therapy   Participation Level:  Did Not Attend despite MHT prompting   Chapel Silverthorn Grossman-Orr, LCSW 08/26/2016, 1:00 PM     

## 2016-08-26 NOTE — Progress Notes (Signed)
D) Pt has been in his bed much of the day stating that he doesn't feel good, "my stomach hurts". Pt was upset and angry with the NP. Verbalized later, how sorry he was, "I just don't feel good". Pt requesting information on a program where they treat persons with Borderline Personality Disorder. States, "[place] with BPD [place]. I was transferred to another facility and at that time things became distorted and I left there. Felt as thought things were real. I would like to find a place where I can go and be treated for BPD". Also admitted to superficially cutting self so "I could go to the hospital. My grandmother kicked me out of the house.  Pt rates his depression as a   1, hopelessness at a  1 ,  And his anxiety as an 8  . Denies SI and HI. A) given support, reassurance and praise. Provided with a 1:1. Encouragement given to talk with the providers so that he can receive appropriate treatments.  R) Denies SI and HI.

## 2016-08-26 NOTE — Progress Notes (Addendum)
Nursing Progress Note 7p-7a  D) Patient presents with anxious affect and irritable mood. Patient reports that he "slept the day away" due to being admitted late last night. Patient states "I don't remember what I said to the doctor today. What happened to all my meds?" Patient reports he has been prescribed medications for anxiety including xanax, ativan and "something for my herpes". Patient states he wants "the ointment for my lips and wrist I got from the ED."  Patient has no s/s of infection to wrist or lips. SF cuts to left wrist noted. Lips are dry and cracked. Patient impatient with Clinical research associatewriter to speak to provider regarding new orders. Patient anxious and with slight tremor of the hands. Patient denies SI/HI or AVH but endorses a headache. Patient complains of anxiety and states "I feel like I am crawling out of my skin". Patient contracts for safety at this time.   A) Emotional support given. NP Barbara CowerJason notified for patient's anxiety level and antibiotic ointment. New orders received. Patient medicated with PM orders as prescribed. Medications reviewed with patient. Patient on q15 min safety checks. Opportunities for questions or concerns presented to patient. Patient encouraged to continue to work on treatment goals and speak to the doctor tomorrow regarding home medications.  R) Patient receptive to interaction with nurse. Patient remains safe on the unit at this time. Patient is resting in bed without complaints. Will continue to monitor.

## 2016-08-26 NOTE — [facility] Counselor (Signed)
CSW made 3rd attempt to see patient in order to complete PSA. At this time patient stated that he had been vomiting all day and requested that the PSA be delayed until tomorrow.  CSW will follow.  Beverly Sessionsywan J Jaryn Hocutt MSW, LCSW

## 2016-08-27 LAB — PROLACTIN: PROLACTIN: 55 ng/mL — AB (ref 4.0–15.2)

## 2016-08-27 NOTE — Progress Notes (Signed)
  D: Pt continues to be labile, sarcastic, and intrusive. Pt had to be redirected several times because of intrusiveness. Pt asked to take a bath because the shower was a little too cool. Writer informed pt that the water in the tub would be the same. Pt has no other questions or concerns.    A:  Support and encouragement was offered. 15 min checks continued for safety.  R: Pt remains safe.

## 2016-08-27 NOTE — Progress Notes (Signed)
Recreation Therapy Notes  Date: 08/27/16 Time: 0930 Location: 300 Hall Group Room  Group Topic: Stress Management  Goal Area(s) Addresses:  Patient will verbalize importance of using healthy stress management.  Patient will identify positive emotions associated with healthy stress management.   Intervention: Stress Management  Activity :  Daily Calm.  LRT introduced the stress management technique of meditation to the group.  LRT played a meditation from the Calm App to allow patients the opportunity to engage in the meditation.  Patients were to follow along with the meditation to fully engage in the technique.  Education:  Stress Management, Discharge Planning.   Education Outcome: Acknowledges edcuation/In group clarification offered/Needs additional education  Clinical Observations/Feedback: Pt did not attend group.    Caroll RancherMarjette Noha Milberger, LRT/CTRS         Caroll RancherLindsay, Dayan Desa A 08/27/2016 12:18 PM

## 2016-08-27 NOTE — Progress Notes (Signed)
Patient ID: Anthony Haney, male   DOB: 02/14/1981, 36 y.o.   MRN: [id number]  DAR: Pt. Denies SI/HI and A/V Hallucinations. He rates depression 1/10, hopelessness 1/10, and anxiety 7/10. He reports sleep is good, appetite is good, energy level is normal, and concentration is good. Patient does not report any pain or discomfort at this time. Support and encouragement provided to the patient. Scheduled medications administered to patient per physician's orders. PRN Vistaril and PRN nicotine gum administered to patient to decrease symptoms. He is intrusive and labile in mood. He is seen in the milieu with a washcloth on his head. He was instructed to take the washcloth off his head. He stated, "I'm not gonna take it off!" He eventually did before he went to the gym for recreation time. He has spent the majority of the day talking on the phone and hanging the phone up and redialing numbers. Patient was educated about giving his peers time on the phone. He is restless and hyperactive at this time. Boundaries and redirection are put into place however patient is verbally aggressive when patient is confronted by said boundaries. Patient's WBC was 16.6. MD [name] was notified of his WBC count. Q15 minute checks are maintained for safety.

## 2016-08-27 NOTE — Tx Team (Signed)
Interdisciplinary Treatment and Diagnostic Plan Update  08/27/2016 Time of Session: 9:00am  Anthony Haney MRN: [id number]  Principal Diagnosis: MDD (major depressive disorder), single episode, severe (HCC)  Secondary Diagnoses: Principal Problem:   MDD (major depressive disorder), single episode, severe (HCC) Active Problems:   Herpes   Hepatitis C   Substance-induced psychotic disorder with hallucinations (HCC)   Mild benzodiazepine use disorder   Opioid use disorder, mild, abuse   Inhalant abuse   GERD (gastroesophageal reflux disease)   Acute maxillary sinusitis, unspecified   Current Medications:  Current Facility-Administered Medications  Medication Dose Route Frequency Provider Last Rate Last Dose  . acetaminophen (TYLENOL) tablet 650 mg  650 mg Oral Q6H PRN Jackelyn PolingJason A Berry, NP      . alum & mag hydroxide-simeth (MAALOX/MYLANTA) 200-200-20 MG/5ML suspension 30 mL  30 mL Oral Q4H PRN Jackelyn PolingJason A Berry, NP   30 mL at 08/26/16 0039  . bacitracin ointment   Topical BID PRN Jackelyn PolingJason A Berry, NP   1 application at 08/25/16 2121  . citalopram (CELEXA) tablet 10 mg  10 mg Oral Daily Adonis BrookSheila Agustin, NP   10 mg at 08/27/16 0807  . gabapentin (NEURONTIN) capsule 100 mg  100 mg Oral TID Adonis BrookSheila Agustin, NP   100 mg at 08/27/16 1637  . hydrOXYzine (ATARAX/VISTARIL) tablet 25 mg  25 mg Oral TID PRN Jackelyn PolingJason A Berry, NP   25 mg at 08/27/16 1453  . ibuprofen (ADVIL,MOTRIN) tablet 600 mg  600 mg Oral Q6H PRN Jackelyn PolingJason A Berry, NP   600 mg at 08/25/16 2100  . loperamide (IMODIUM) capsule 2-4 mg  2-4 mg Oral PRN Adonis BrookSheila Agustin, NP      . LORazepam (ATIVAN) tablet 1 mg  1 mg Oral Q6H PRN Adonis BrookSheila Agustin, NP      . LORazepam (ATIVAN) tablet 1 mg  1 mg Oral TID Adonis BrookSheila Agustin, NP   1 mg at 08/27/16 1637   Followed by  . [START ON 08/28/2016] LORazepam (ATIVAN) tablet 1 mg  1 mg Oral BID Adonis BrookSheila Agustin, NP       Followed by  . [START ON 08/30/2016] LORazepam (ATIVAN) tablet 1 mg  1 mg Oral Daily Adonis BrookSheila Agustin, NP       . magnesium hydroxide (MILK OF MAGNESIA) suspension 30 mL  30 mL Oral Daily PRN Jackelyn PolingJason A Berry, NP      . multivitamin with minerals tablet 1 tablet  1 tablet Oral Daily Adonis BrookSheila Agustin, NP   1 tablet at 08/27/16 0807  . nicotine polacrilex (NICORETTE) gum 2 mg  2 mg Oral PRN Craige CottaFernando A [name], MD   2 mg at 08/27/16 1453  . ondansetron (ZOFRAN-ODT) disintegrating tablet 4 mg  4 mg Oral Q8H PRN Adonis BrookSheila Agustin, NP      . pantoprazole (PROTONIX) EC tablet 40 mg  40 mg Oral Daily Adonis BrookSheila Agustin, NP   40 mg at 08/27/16 0807  . pneumococcal 23 valent vaccine (PNU-IMMUNE) injection 0.5 mL  0.5 mL Intramuscular Tomorrow-1000 [name] [name], MD      . thiamine (VITAMIN B-1) tablet 100 mg  100 mg Oral Daily Adonis BrookSheila Agustin, NP   100 mg at 08/27/16 0807  . traZODone (DESYREL) tablet 50 mg  50 mg Oral QHS,MR X 1 Jackelyn PolingJason A Berry, NP   50 mg at 08/26/16 2129  . valACYclovir (VALTREX) tablet 500 mg  500 mg Oral Daily Adonis BrookSheila Agustin, NP   500 mg at 08/27/16 [id number]   PTA Medications: Prescriptions Prior to Admission  Medication Sig Dispense Refill Last Dose  . furosemide (LASIX) 20 MG tablet Take 1 tablet (20 mg total) by mouth daily. (Patient not taking: Reported on 08/24/2016) 10 tablet 0 Not Taking at Unknown time    Patient Stressors: Marital or family conflict Medication change or noncompliance Substance abuse  Patient Strengths: Ability for insight Average or above average intelligence Capable of independent living General fund of knowledge Motivation for treatment/growth  Treatment Modalities: Medication Management, Group therapy, Case management,  1 to 1 session with clinician, Psychoeducation, Recreational therapy.   Physician Treatment Plan for Primary Diagnosis: MDD (major depressive disorder), single episode, severe (HCC) Long Term Goal(s): Improvement in symptoms so as ready for discharge Improvement in symptoms so as ready for discharge   Short Term Goals: Ability to identify changes in  lifestyle to reduce recurrence of condition will improve Ability to verbalize feelings will improve Ability to disclose and discuss suicidal ideas Ability to demonstrate self-control will improve Ability to identify and develop effective coping behaviors will improve Ability to maintain clinical measurements within normal limits will improve Compliance with prescribed medications will improve Ability to identify triggers associated with substance abuse/mental health issues will improve  Medication Management: Evaluate patient's response, side effects, and tolerance of medication regimen.  Therapeutic Interventions: 1 to 1 sessions, Unit Group sessions and Medication administration.  Evaluation of Outcomes: Progressing  Physician Treatment Plan for Secondary Diagnosis: Principal Problem:   MDD (major depressive disorder), single episode, severe (HCC) Active Problems:   Herpes   Hepatitis C   Substance-induced psychotic disorder with hallucinations (HCC)   Mild benzodiazepine use disorder   Opioid use disorder, mild, abuse   Inhalant abuse   GERD (gastroesophageal reflux disease)   Acute maxillary sinusitis, unspecified  Long Term Goal(s): Improvement in symptoms so as ready for discharge Improvement in symptoms so as ready for discharge   Short Term Goals: Ability to identify changes in lifestyle to reduce recurrence of condition will improve Ability to verbalize feelings will improve Ability to disclose and discuss suicidal ideas Ability to demonstrate self-control will improve Ability to identify and develop effective coping behaviors will improve Ability to maintain clinical measurements within normal limits will improve Compliance with prescribed medications will improve Ability to identify triggers associated with substance abuse/mental health issues will improve     Medication Management: Evaluate patient's response, side effects, and tolerance of medication  regimen.  Therapeutic Interventions: 1 to 1 sessions, Unit Group sessions and Medication administration.  Evaluation of Outcomes: Progressing   RN Treatment Plan for Primary Diagnosis: MDD (major depressive disorder), single episode, severe (HCC) Long Term Goal(s): Knowledge of disease and therapeutic regimen to maintain health will improve  Short Term Goals: Ability to remain free from injury will improve, Ability to verbalize frustration and anger appropriately will improve, Ability to demonstrate self-control, Ability to participate in decision making will improve, Ability to verbalize feelings will improve, Ability to disclose and discuss suicidal ideas, Ability to identify and develop effective coping behaviors will improve and Compliance with prescribed medications will improve  Medication Management: RN will administer medications as ordered by provider, will assess and evaluate patient's response and provide education to patient for prescribed medication. RN will report any adverse and/or side effects to prescribing provider.  Therapeutic Interventions: 1 on 1 counseling sessions, Psychoeducation, Medication administration, Evaluate responses to treatment, Monitor vital signs and CBGs as ordered, Perform/monitor CIWA, COWS, AIMS and Fall Risk screenings as ordered, Perform wound care treatments as ordered.  Evaluation of Outcomes:  Progressing   LCSW Treatment Plan for Primary Diagnosis: MDD (major depressive disorder), single episode, severe (HCC) Long Term Goal(s): Safe transition to appropriate next level of care at discharge, Engage patient in therapeutic group addressing interpersonal concerns.  Short Term Goals: Engage patient in aftercare planning with referrals and resources, Increase social support, Increase ability to appropriately verbalize feelings, Increase emotional regulation, Facilitate acceptance of mental health diagnosis and concerns, Facilitate patient progression  through stages of change regarding substance use diagnoses and concerns, Identify triggers associated with mental health/substance abuse issues and Increase skills for wellness and recovery  Therapeutic Interventions: Assess for all discharge needs, 1 to 1 time with Social worker, Explore available resources and support systems, Assess for adequacy in community support network, Educate family and significant other(s) on suicide prevention, Complete Psychosocial Assessment, Interpersonal group therapy.  Evaluation of Outcomes: Progressing   Progress in Treatment: Attending groups: Yes. Participating in groups: Yes. Taking medication as prescribed: Yes. Toleration medication: Yes. Family/Significant other contact made: No, will contact:  CSW attempted to contact grandmother  Patient understands diagnosis: Yes. Discussing patient identified problems/goals with staff: Yes. Medical problems stabilized or resolved: Yes. Denies suicidal/homicidal ideation: Yes. Issues/concerns per patient self-inventory: No. Other: NA  New problem(s) identified: No, Describe:  NA  New Short Term/Long Term Goal(s): NA  Discharge Plan or Barriers: Pt plans to return home and follow up with outpatient.    Reason for Continuation of Hospitalization: Hallucinations Medication stabilization  Estimated Length of Stay: 3-5 days   Attendees: Patient: 08/27/2016 5:11 PM  Physician: Dr. Jama Flavors 08/27/2016 5:11 PM  Nursing: Anthony Lanes, RN  08/27/2016 5:11 PM  RN Care Manager:Jennifer, RN  08/27/2016 5:11 PM  Social Worker: Anthony Haney,Anthony Haney 08/27/2016 5:11 PM  Recreational Therapist:  08/27/2016 5:11 PM  Other:  08/27/2016 5:11 PM  Other:  08/27/2016 5:11 PM  Other: 08/27/2016 5:11 PM    Scribe for Treatment Team: Rondall Allegra, Anthony Haney 08/27/2016 5:11 PM

## 2016-08-27 NOTE — [facility] Suicide Risk Assessment (Signed)
[facility] INPATIENT:  Family/Significant Other Suicide Prevention Education  Suicide Prevention Education:  Contact Attempts: Anthony Haney [phone] (grandmother) has been identified by the patient as the family member/significant other with whom the patient will be residing, and identified as the person(s) who will aid the patient in the event of a mental health crisis.  With written consent from the patient, two attempts were made to provide suicide prevention education, prior to and/or following the patient's discharge.  We were unsuccessful in providing suicide prevention education.  A suicide education pamphlet was given to the patient to share with family/significant other.  Date and time of first attempt:08/27/2016 12:00pm  Date and time of second attempt:.08/27/2016 5:07 PM   Anthony Haney MSW, LCSWA  08/27/2016, 5:06 PM

## 2016-08-27 NOTE — Progress Notes (Addendum)
[facility] MD Progress Note  08/27/2016 12:48 PM Anthony Haney  MRN:  [id number] Subjective: patient states he is feeling much better. At this time focusing on being discharged soon. Denies depression, denies any suicidal ideations, endorses some anxiety. Denies any withdrawal symptoms. Denies medication side effects. Objective:  I have discussed case with treatment team and have met with patient. Patient is 36 year old male, who presented with psychotic symptoms- auditory hallucinations- feelings of derealization, recent self inflicted cut to wrist ( did not need sutures). Chart notes indicate " huffing ( aerosol- duster can- abuse) . Patient states that this was an isolated instance - " I was just trying to feel better", and at this time denies any patter of Inhalant Abuse. Denies abusing any other drug types or alcohol recently. Chart notes indicate prior history of ED visits for substance abuse ( opiates, inhalants ) . Of note, admission UDS negative, BAL negative . Presents vaguely anxious, irritable, and staff reports indicate he has needed some redirection due to intrusiveness. Some group participation . Patient denies any suicidal ideations at this time, states he feels ready for discharge. Denies medication side effects. Labs - Li level < 0.06   Principal Problem: MDD (major depressive disorder), single episode, severe ([place]) Diagnosis:   Patient Active Problem List   Diagnosis Date Noted  . Herpes [B00.9] 08/25/2016  . Hepatitis C [B19.20] 08/25/2016  . Substance-induced psychotic disorder with hallucinations ([place]) [F19.951] 08/25/2016  . 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 Opioid use disorder, mild, abuse [F11.10] 08/25/2016  . Inhalant abuse [F18.10] 08/25/2016  . MDD (major depressive disorder), single episode, severe ([place]) [F32.2] 08/25/2016  . GERD (gastroesophageal reflux disease) [K21.9] 08/25/2016  . Acute pharyngitis [J02.9] 08/25/2016  . Acute maxillary  sinusitis, unspecified [J01.00] 08/25/2016  . Substance abuse withdrawal ([place]) [F19.239] 05/08/2012  . Polysubstance abuse [F19.10] 05/08/2012  . Benzodiazepine dependence ([place]) [F13.20] 03/13/2012   Total Time spent with patient: 20 minutes   Past Psychiatric History: see HPI  Past Medical History:  Past Medical History:  Diagnosis Date  . Drug abuse   . Hepatitis C   . Herpes     Past Surgical History:  Procedure Laterality Date  . HAND SURGERY     Left   Family History: History reviewed. No pertinent family history. Family Psychiatric  History:  See HPI Social History:  History  Alcohol Use  . Yes    Comment: 2 per month     History  Drug Use  . Types: IV, Benzodiazepines, Cocaine, Marijuana, Heroin    Comment: "as often as possible"    Social History   Social History  . Marital status: Single    Spouse name: N/A  . Number of children: N/A  . Years of education: N/A   Social History Main Topics  . Smoking status: Current Every Day Smoker    Packs/day: 1.00    Years: 10.00    Types: Cigarettes  . Smokeless tobacco: Never Used  . Alcohol use Yes     Comment: 2 per month  . Drug use: Yes    Types: IV, Benzodiazepines, Cocaine, Marijuana, Heroin     Comment: "as often as possible"  . Sexual activity: Yes    Birth control/ protection: Condom   Other Topics Concern  . None   Social History Narrative  . None   Additional Social History:   Sleep:improving  Appetite:  Fair  Current Medications: Current Facility-Administered Medications  Medication Dose Route Frequency  Provider Last Rate Last Dose  . acetaminophen (TYLENOL) tablet 650 mg  650 mg Oral Q6H PRN Rozetta Nunnery, NP      . alum & mag hydroxide-simeth (MAALOX/MYLANTA) 200-200-20 MG/5ML suspension 30 mL  30 mL Oral Q4H PRN Rozetta Nunnery, NP   30 mL at 08/26/16 0039  . bacitracin ointment   Topical BID PRN Rozetta Nunnery, NP   1 application at 01/22/93 2121  . citalopram (CELEXA) tablet 10 mg   10 mg Oral Daily Kerrie Buffalo, NP   10 mg at 08/27/16 0807  . gabapentin (NEURONTIN) capsule 100 mg  100 mg Oral TID Kerrie Buffalo, NP   100 mg at 08/27/16 1129  . hydrOXYzine (ATARAX/VISTARIL) tablet 25 mg  25 mg Oral TID PRN Rozetta Nunnery, NP   25 mg at 08/25/16 0235  . ibuprofen (ADVIL,MOTRIN) tablet 600 mg  600 mg Oral Q6H PRN Rozetta Nunnery, NP   600 mg at 08/25/16 2100  . loperamide (IMODIUM) capsule 2-4 mg  2-4 mg Oral PRN Kerrie Buffalo, NP      . LORazepam (ATIVAN) tablet 1 mg  1 mg Oral Q6H PRN Kerrie Buffalo, NP      . LORazepam (ATIVAN) tablet 1 mg  1 mg Oral TID Kerrie Buffalo, NP   1 mg at 08/27/16 1130   Followed by  . [START ON 08/28/2016] LORazepam (ATIVAN) tablet 1 mg  1 mg Oral BID Kerrie Buffalo, NP       Followed by  . [START ON 08/30/2016] LORazepam (ATIVAN) tablet 1 mg  1 mg Oral Daily Kerrie Buffalo, NP      . magnesium hydroxide (MILK OF MAGNESIA) suspension 30 mL  30 mL Oral Daily PRN Rozetta Nunnery, NP      . multivitamin with minerals tablet 1 tablet  1 tablet Oral Daily Kerrie Buffalo, NP   1 tablet at 08/27/16 0807  . nicotine polacrilex (NICORETTE) gum 2 mg  2 mg Oral PRN Jenne Campus, MD   2 mg at 08/27/16 1244  . ondansetron (ZOFRAN-ODT) disintegrating tablet 4 mg  4 mg Oral Q8H PRN Kerrie Buffalo, NP      . pantoprazole (PROTONIX) EC tablet 40 mg  40 mg Oral Daily Kerrie Buffalo, NP   40 mg at 08/27/16 0807  . pneumococcal 23 valent vaccine (PNU-IMMUNE) injection 0.5 mL  0.5 mL Intramuscular Tomorrow-1000 Elfego Giammarino A [place] Zaragoza, MD      . thiamine (VITAMIN B-1) tablet 100 mg  100 mg Oral Daily Kerrie Buffalo, NP   100 mg at 08/27/16 0807  . traZODone (DESYREL) tablet 50 mg  50 mg Oral QHS,MR X 1 Rozetta Nunnery, NP   50 mg at 08/26/16 2129  . valACYclovir (VALTREX) tablet 500 mg  500 mg Oral Daily Kerrie Buffalo, NP   500 mg at 08/27/16 8546    Lab Results:  Results for orders placed or performed during the hospital encounter of 08/25/16 (from the past 48 hour(s))   Lithium level     Status: Abnormal   Collection Time: 08/26/16  6:03 PM  Result Value Ref Range   Lithium Lvl <0.06 (L) 0.60 - 1.20 mmol/L    Comment: REPEATED TO VERIFY Performed at [facility] [address], [place], [place] [address]     Blood Alcohol level:  Lab Results  Component Value Date   [facility] <5 08/23/2016   ETH <11 00/93/8182    Metabolic Disorder Labs: Lab Results  Component Value  Date   HGBA1C 4.7 (L) 08/25/2016   MPG 88 08/25/2016   Lab Results  Component Value Date   PROLACTIN 55.0 (H) 08/25/2016   Lab Results  Component Value Date   CHOL 162 08/25/2016   TRIG 205 (H) 08/25/2016   HDL 45 08/25/2016   CHOLHDL 3.6 08/25/2016   VLDL 41 (H) 08/25/2016   LDLCALC 76 08/25/2016    Physical Findings: AIMS: Facial and Oral Movements Muscles of Facial Expression: None, normal Lips and Perioral Area: None, normal Jaw: None, normal Tongue: None, normal,Extremity Movements Upper (arms, wrists, hands, fingers): None, normal Lower (legs, knees, ankles, toes): None, normal, Trunk Movements Neck, shoulders, hips: None, normal, Overall Severity Severity of abnormal movements (highest score from questions above): None, normal Incapacitation due to abnormal movements: None, normal Patient's awareness of abnormal movements (rate only patient's report): No Awareness, Dental Status Current problems with teeth and/or dentures?: No Does patient usually wear dentures?: No  CIWA:  CIWA-Ar Total: 5 COWS:     Musculoskeletal: Strength & Muscle Tone: within normal limits Gait & Station: normal Patient leans: N/A  Psychiatric Specialty Exam: Physical Exam  Nursing note and vitals reviewed. Psychiatric: His behavior is normal. Thought content normal. His mood appears anxious. His affect is angry, blunt, labile and inappropriate. His speech is rapid and/or pressured. Cognition and memory are impaired. He exhibits abnormal recent memory and abnormal  remote memory.    Review of Systems  Constitutional: Negative.   HENT: Negative.   Eyes: Negative.   Respiratory: Negative.   Cardiovascular: Negative.   Gastrointestinal: Negative.   Genitourinary: Negative.   Skin: Negative.   Psychiatric/Behavioral: Positive for substance abuse. The patient is nervous/anxious.     Blood pressure 104/78, pulse 92, temperature 98.1 F (36.7 C), temperature source Oral, resp. rate 16, height _0  (1.727 m), weight 65.3 kg (144 lb).Body mass index is 21.9 kg/m.  General Appearance: Fairly Groomed  Eye Contact:  Good  Speech:  Normal Rate and Pressured  Volume:  Normal  Mood:  denies depression, states mood is "OK", presents anxious   Affect:  vaguely anxious, irritable   Thought Process:  Linear  Orientation:  Full (Time, Place, and Person)  Thought Content:  Denies hallucinations, no delusions, not internally preoccupied   Suicidal Thoughts:  No denies any suicidal or self injurious ideations, denies any homicidal or violent ideations, contracts for safety on unit   Homicidal Thoughts:  No  Memory: recent and remote grossly intact   Judgement: fair   Insight:  Fair   Psychomotor Activity:  Mild distal tremors, no diaphoresis  Concentration:  Concentration: Good and Attention Span: Good  Recall:  Good  Fund of Knowledge:  Good  Language:  Good  Akathisia:  No  Handed:  Right  AIMS (if indicated):     Assets:  Housing Resilience Social Support  ADL's:  Improving   Cognition:  WNL  Sleep:  Number of Hours: 6   Assessment - patient reports improvement compared to admission, currently minimizes depression , denies SI. Presents anxious, and as per RN staff report vaguely restless and intrusive. At this time denies psychotic symptoms and does not appear internally preoccupied, no delusions are expressed, and denies hallucinations.  Insight appears limited, minimizes recent symptoms /substance abuse issues.Tolerating medications well, denies  side effects.   Treatment Plan Summary: Encourage group and milieu participation to work on coping skills and symptom reduction Encourage efforts to improve insight, treatment motivation regarding abstinence and recovery efforts  At  this time on Ativan detox protocol- although denies recent BZD or alcohol abuse, states he feels it is working, preventing more severe anxiety or WDL, so will continue . Continue Trazodone 50 mgrs QHS PRN for insomnia Continue Celexa 10 mgrs QDAY for anxiety, depression  Repeat CBC to follow up on leukocytosis, patient denies fever, chills or symptoms of infection at this time Treatment team working on disposition planning options     Neita Garnet, MD  08/27/2016, 12:48 PM   Patient ID: Anthony Haney, male   DOB: 01-03-1981, 36 y.o.   MRN: [id number]
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
## 2016-08-28 LAB — HEPATITIS PANEL, ACUTE
HEP A IGM: NEGATIVE
HEP B C IGM: NEGATIVE
Hepatitis B Surface Ag: NEGATIVE

## 2016-08-28 MED ORDER — TRAZODONE HCL 50 MG PO TABS: ORAL_TABLET | ORAL | 0 refills | Status: DC

## 2016-08-28 MED ORDER — CITALOPRAM HYDROBROMIDE 10 MG PO TABS: 10.0000 mg | ORAL_TABLET | Freq: Every day | ORAL | 0 refills | Status: AC

## 2016-08-28 MED ORDER — PANTOPRAZOLE SODIUM 40 MG PO TBEC: 40.0000 mg | DELAYED_RELEASE_TABLET | Freq: Every day | ORAL | 0 refills | Status: AC

## 2016-08-28 MED ORDER — VALACYCLOVIR HCL 500 MG PO TABS: 500.0000 mg | ORAL_TABLET | Freq: Every day | ORAL | 0 refills | Status: AC

## 2016-08-28 MED ORDER — NICOTINE POLACRILEX 2 MG MT GUM: 2.0000 mg | CHEWING_GUM | OROMUCOSAL | 0 refills | Status: AC | PRN

## 2016-08-28 MED ORDER — HYDROXYZINE HCL 25 MG PO TABS: 25.0000 mg | ORAL_TABLET | Freq: Three times a day (TID) | ORAL | 0 refills | Status: AC | PRN

## 2016-08-28 MED ORDER — GABAPENTIN 100 MG PO CAPS: 100.0000 mg | ORAL_CAPSULE | Freq: Three times a day (TID) | ORAL | 0 refills | Status: AC

## 2016-08-28 NOTE — Progress Notes (Signed)
[facility] Group Notes:  (Nursing/MHT/Case Management/Adjunct)  Date:  08/28/2016  Time:  12:10 AM  Type of Therapy:  Psychoeducational Skills  Participation Level:  Minimal  Participation Quality:  Inattentive and Resistant  Affect:  Blunted and Irritable  Cognitive:  Lacking  Insight:  Lacking  Engagement in Group:  Resistant  Modes of Intervention:  Education  Summary of Progress/Problems: Patient described his day as having been "uneventful" and that he will be discharged tomorrow. He refused to offer any additional details about his day. In terms of the theme for the day, his wellness strategy will be to eat on a more regular basis.   Sanyiah Kanzler S 08/28/2016, 12:10 AM

## 2016-08-28 NOTE — Progress Notes (Signed)
  D: Pt stated, "I'm supposed to go to [facility], that's bullshit". Stated [facility]. When asked if he was going pt stated, "they have me going there but I don't know why because I'm not using", referring to drugs. Stated his dad won't talk to him because his ex gf has the dad thinking he a horrible person. Pt has no other questions or concerns.    A:  Support and encouragement was offered. 15 min checks continued for safety.  R: Pt remains safe.

## 2016-08-28 NOTE — [facility] Suicide Risk Assessment (Signed)
[facility] Discharge Suicide Risk Assessment   Principal Problem: MDD (major depressive disorder), single episode, severe [facility]) Discharge Diagnoses:  Patient Active Problem List   Diagnosis Date Noted  . Herpes [B00.9] 08/25/2016  . Hepatitis C [B19.20] 08/25/2016  . 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 Opioid use disorder, mild, abuse [F11.10] 08/25/2016  . Inhalant abuse [F18.10] 08/25/2016  . MDD (major depressive disorder), single episode, severe (HCC) [F32.2] 08/25/2016  . GERD (gastroesophageal reflux disease) [K21.9] 08/25/2016  . Acute pharyngitis [J02.9] 08/25/2016  . Acute maxillary sinusitis, unspecified [J01.00] 08/25/2016  . Substance abuse withdrawal (HCC) [F19.239] 05/08/2012  . Polysubstance abuse [F19.10] 05/08/2012  . Benzodiazepine dependence (HCC) [F13.20] 03/13/2012    Total Time spent with patient: 30 minutes  Musculoskeletal: Strength & Muscle Tone: within normal limits Gait & Station: normal Patient leans: N/A  Psychiatric Specialty Exam: ROS denies headache, describes minor sinus congestion,  no chest pain, no shortness of breath, no fever, no chills  Blood pressure 112/73, pulse 96, temperature 98.1 F (36.7 C), temperature source Oral, resp. rate 18, height 5\' 8"  (1.727 m), weight 65.3 kg (144 lb).Body mass index is 21.9 kg/m.  General Appearance: improved grooming  Eye Contact::  Good  Speech:  Normal Rate409  Volume:  Normal  Mood:  improved, denies depression, states " I am feeling OK"  Affect:  appropriate, reactive, less irritable  Thought Process:  Linear  Orientation:  Full (Time, Place, and Person)  Thought Content:  no hallucinations, no delusions, not internally preoccupied   Suicidal Thoughts:  No denies any suicidal or self injurious ideations, denies any homicidal or violent ideations  Homicidal Thoughts:  No  Memory:  recent and  remote grossly intact   Judgement:  Other:  improving compared to admission  Insight:  Fair  Psychomotor Activity:  Normal  Concentration:  Good  Recall:  Good  Fund of Knowledge:Good  Language: Good  Akathisia:  Negative  Handed:  Right  AIMS (if indicated):     Assets:  Desire for Improvement Housing Resilience  Sleep:  Number of Hours: 5.75  Cognition: WNL  ADL's:  Intact   Mental Status Per Nursing Assessment::   On Admission:     Demographic Factors:  36 year old male, lives with grandmother   Loss Factors: Relationship tension, conflict   Historical Factors: History of depression, anxiety, history of episodes of derealization, depersonalization, history of inhalant abuse . Reports he has been diagnosed with Borderline Personality Disorder in the past   Risk Reduction Factors:   Sense of responsibility to family, Living with another person, especially a relative and Positive coping skills or problem solving skills  Continued Clinical Symptoms:  At this time patient is alert, attentive, improved grooming , good eye contact, speech normal , denies depression, presents with fuller range of affect, no thought disorder, no suicidal or self injurious ideations, no homicidal or violent ideations, no hallucinations, no delusions, not internally preoccupied .  Behavior on unit calm, in good control No current withdrawal symptoms- no tremors, no diaphoresis, no restlessness, vitals stable Denies medication side effects  Cognitive Features That Contribute To Risk:  No gross cognitive deficits noted upon discharge. Is alert , attentive, and oriented x 3   Suicide Risk:  Mild:  Suicidal ideation of limited frequency, intensity, duration, and specificity.  There are no identifiable plans, no associated intent, mild dysphoria and related symptoms, good self-control (both objective and subjective assessment),  few other risk factors, and identifiable protective factors, including  available and accessible social support.  Follow-up Information    [facility] Follow up on 09/03/2016.   Why:  Intake appointment on Feb. 5th at 7:45am.   Contact information: [address] [place] [address] [phone]           Plan Of Care/Follow-up recommendations:  Activity:  as tolerated  Diet:  Regular Tests:  NA Other:  See below Patient is requesting discharge and there are no current grounds for involuntary commitment He is leaving unit in good spirits  Plans to return home Plans [facility], as above *Negative consequences of substance/inhalant abuse reviewed, patient encouraged to maintain abstinence from all illegal, addictive substances. Nehemiah MassedOBOS, Wali Reinheimer, MD 08/28/2016, 10:20 AM

## 2016-08-28 NOTE — Progress Notes (Signed)
Pt d/c from the hospital with bus passes. All items returned. D/C instructions given and prescriptions given. Pt did not wait to receive sample medication. Pt denies si and hi.

## 2016-08-28 NOTE — Progress Notes (Addendum)
  [facility] :  Will you be returning to the same living situation after discharge:  Yes,  Pt plans to stay with his grandmother until [facility] At discharge, do you have transportation home?: Yes,  Pt provided with bus pass and PART fare Do you have the ability to pay for your medications: Yes,  Pt provided with samples and prescriptions  Release of information consent forms completed and in the chart;  Patient's signature needed at discharge.  Patient to Follow up at: Follow-up Information    [facility] Follow up on 09/03/2016.   Why:  Intake appointment on Feb. 5th at 7:45am.   Contact information: [address] [place] [address] [phone]           Next level of care provider has [facility] Link:no  Safety Planning and Suicide Prevention discussed: Yes,  with Pt; 2 unsuccessful attempts with grandmother  Have you used any form of tobacco in the last 30 days? (Cigarettes, Smokeless Tobacco, Cigars, and/or Pipes): Yes  Has patient been referred to the Quitline?: Yes, faxed on 08/28/16  Patient has been referred for addiction treatment: Yes  Verdene LennertLauren C Hlee Fringer 08/28/2016, 10:15 AM

## 2016-08-28 NOTE — Discharge Summary (Signed)
Physician Discharge Summary Note  Patient:  Anthony Haney is an 36 y.o., male MRN:  [id number] DOB:  02/18/1981 Patient phone:  [phone] (home)  Patient address:   [address] [place] [address],  Total Time spent with patient: Greater than 30 minutes  Date of Admission:  08/25/2016  Date of Discharge: 08-28-16  Reason for Admission: Psychosis & suicidal ideations.  Principal Problem: MDD (major depressive disorder), single episode, severe [facility](HCC)  Discharge Diagnoses: Patient Active Problem List   Diagnosis Date Noted  . Herpes [B00.9] 08/25/2016  . Hepatitis C [B19.20] 08/25/2016  . 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 Opioid use disorder, mild, abuse [F11.10] 08/25/2016  . Inhalant abuse [F18.10] 08/25/2016  . MDD (major depressive disorder), single episode, severe (HCC) [F32.2] 08/25/2016  . GERD (gastroesophageal reflux disease) [K21.9] 08/25/2016  . Acute pharyngitis [J02.9] 08/25/2016  . Acute maxillary sinusitis, unspecified [J01.00] 08/25/2016  . Substance abuse withdrawal (HCC) [F19.239] 05/08/2012  . Polysubstance abuse [F19.10] 05/08/2012  . Benzodiazepine dependence (HCC) [F13.20] 03/13/2012   Past Psychiatric History: Hx. Polysubstance dependence  Past Medical History:  Past Medical History:  Diagnosis Date  . Drug abuse   . Hepatitis C   . Herpes     Past Surgical History:  Procedure Laterality Date  . HAND SURGERY     Left   Family History: History reviewed. No pertinent family history.  Family Psychiatric  History: See Md's SRA  Social History:  History  Alcohol Use  . Yes    Comment: 2 per month     History  Drug Use  . Types: IV, Benzodiazepines, Cocaine, Marijuana, Heroin    Comment: "as often as possible"    Social History   Social History  . Marital status: Single    Spouse name: N/A  . Number of children: N/A  .  Years of education: N/A   Social History Main Topics  . Smoking status: Current Every Day Smoker    Packs/day: 1.00    Years: 10.00    Types: Cigarettes  . Smokeless tobacco: Never Used  . Alcohol use Yes     Comment: 2 per month  . Drug use: Yes    Types: IV, Benzodiazepines, Cocaine, Marijuana, Heroin     Comment: "as often as possible"  . Sexual activity: Yes    Birth control/ protection: Condom   Other Topics Concern  . None   Social History Narrative  . 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 Pt shares that, during the time when the psychosis appeared, he recalls going through conflicts with his father and ex-fiance, but no other changes were occurring. Pt describes his psychosis as things seeming unreal to him and confusion (e.g. Pt recalled watching TV and the conversations not making any sense to him). Pt also reports hearing voices, for the past 2 weeks, telling him he's not worth it and should kill himself. Pt discloses that yesterday, he couldn't take it anymore and he made an attempt to slit his wrist (superficial laceration noted). Pt also reports that he sniffed @ 25 air duster cans. Pt indicates that both of these actions were suicide attempts. Pt voices fear that he will try to kill himself again. Pt reports that he has had SI before, but has never acted on the thoughts. Pt is tearful and  depressed during assessment. No HI.   Anthony Haney was admitted to the [facility] adult unit for worsening psychosis, substance induced. He reported on admission of having visual/auditory hallucinations & confusion.  He reported other stressors in his life such as conflicts with his father & his girlfriend. Anthony presented hx of substance abuse (sniffing air dust). He was in need of mood stabilization treatments.   After evaluation of his presenting symptoms, it was determined that Anthony Haney will need mood  stabilization treatments. He was then medicated & discharged on; Citalopram  10 mg for depression, Hydroxyzine 25 mg prn for anxiety, Gabapentin 100 mg for agitation & Trazodone 50 mg for insomnia. His other pre-existing medical problems were identified & treated by resuming his pertinent home medications for those health issues. He tolerated his treatment regimen without any adverse effects or reactions.  During the course of his hospitalization, Anthony's progress was monitored by observation & his daily report of symptom reduction noted. His emotional & mental status were assessed & monitored by daily self-inventory reports completed by him & the clinical staff. He was also evaluated daily by the treatment team for mood stability & plans for continued recovery after discharge. Anthony's motivation was an integral factor his mood stability. He was offered further treatment options upon discharge on outpatient basis as listed below. He was provided with all the necessary information needed to make this appointment without problems.   Upon discharge, Anthony Haney was both mentally & medically stable. He is adamntly denying suicidal/homicidal ideation, auditory/visual/tactile hallucinations, delusional thoughts & or paranoia. He was provided with a 7 days worth, supply samples of his [facility] discharge medications. He left [facility] with all personal belongings in no apparent distress. Transportation per city bus. [facility] assisted with bus pass.  Physical Findings: AIMS: Facial and Oral Movements Muscles of Facial Expression: None, normal Lips and Perioral Area: None, normal Jaw: None, normal Tongue: None, normal,Extremity Movements Upper (arms, wrists, hands, fingers): None, normal Lower (legs, knees, ankles, toes): None, normal, Trunk Movements Neck, shoulders, hips: None, normal, Overall Severity Severity of abnormal movements (highest score from questions above): None, normal Incapacitation due to abnormal movements: None,  normal Patient's awareness of abnormal movements (rate only patient's report): No Awareness, Dental Status Current problems with teeth and/or dentures?: No Does patient usually wear dentures?: No  CIWA:  CIWA-Ar Total: 2 COWS:     Musculoskeletal: Strength & Muscle Tone: Greater than 30 minutes Gait & Station: normal Patient leans: N/A  Psychiatric Specialty Exam: Physical Exam  Constitutional: He appears well-developed.  HENT:  Head: Normocephalic.  Eyes: Pupils are equal, round, and reactive to light.  Neck: Normal range of motion.  Cardiovascular: Normal rate.   Respiratory: Effort normal.  GI: Soft.  Genitourinary:  Genitourinary Comments: Deferred  Musculoskeletal: Normal range of motion.  Neurological: He is alert.  Skin: Skin is warm.    Review of Systems  Constitutional: Negative.   HENT: Negative.   Eyes: Negative.   Respiratory: Negative.   Cardiovascular: Negative.   Gastrointestinal: Negative.   Genitourinary: Negative.   Musculoskeletal: Negative.   Skin: Negative.   Neurological: Negative.   Endo/Heme/Allergies: Negative.   Psychiatric/Behavioral: Positive for depression (Stable) and substance abuse (Hx. ). Negative for suicidal ideas.    Blood pressure 112/73, pulse 96, temperature 98.1 F (36.7 C), temperature source Oral, resp. rate 18, height 5\' 8"  (1.727 m), weight 65.3 kg (144 lb).Body mass index is 21.9 kg/m.  See Md's SRA   Have you used any form of tobacco  in the last 30 days? (Cigarettes, Smokeless Tobacco, Cigars, and/or Pipes): Yes  Has this patient used any form of tobacco in the last 30 days? (Cigarettes, Smokeless Tobacco, Cigars, and/or Pipes): Yes, provided with nicorette gum prescription.  Blood Alcohol level:  Lab Results  Component Value Date   [facility] <5 08/23/2016   ETH <11 05/07/2012   Metabolic Disorder Labs:  Lab Results  Component Value Date   HGBA1C 4.7 (L) 08/25/2016   MPG 88 08/25/2016   Lab Results  Component Value  Date   PROLACTIN 55.0 (H) 08/25/2016   Lab Results  Component Value Date   CHOL 162 08/25/2016   TRIG 205 (H) 08/25/2016   HDL 45 08/25/2016   CHOLHDL 3.6 08/25/2016   VLDL 41 (H) 08/25/2016   LDLCALC 76 08/25/2016   See Psychiatric Specialty Exam and Suicide Risk Assessment completed by Attending Physician prior to discharge.  Discharge destination:  Home  Is patient on multiple antipsychotic therapies at discharge:  No   Has Patient had three or more failed trials of antipsychotic monotherapy by history:  No  Recommended Plan for Multiple Antipsychotic Therapies: NA  Allergies as of 08/28/2016   No Known Allergies     Medication List    STOP taking these medications   furosemide 20 MG tablet Commonly known as:  LASIX     TAKE these medications     Indication  citalopram 10 MG tablet Commonly known as:  CELEXA Take 1 tablet (10 mg total) by mouth daily. For depression  Indication:  Depression   gabapentin 100 MG capsule Commonly known as:  NEURONTIN Take 1 capsule (100 mg total) by mouth 3 (three) times daily. For agitation  Indication:  Agitation   hydrOXYzine 25 MG tablet Commonly known as:  ATARAX/VISTARIL Take 1 tablet (25 mg total) by mouth 3 (three) times daily as needed for anxiety.  Indication:  Anxiety Neurosis   nicotine polacrilex 2 MG gum Commonly known as:  NICORETTE Take 1 each (2 mg total) by mouth as needed for smoking cessation.  Indication:  Nicotine Addiction   pantoprazole 40 MG tablet Commonly known as:  PROTONIX Take 1 tablet (40 mg total) by mouth daily. For acid reflux  Indication:  Gastroesophageal Reflux Disease   traZODone 50 MG tablet Commonly known as:  DESYREL Take 1 tablet (50 mg) at bedtime: for sleep  Indication:  Trouble Sleeping   valACYclovir 500 MG tablet Commonly known as:  VALTREX Take 1 tablet (500 mg total) by mouth daily. For herpes outbreak  Indication:  Genital Herpes      Follow-up Information     [facility] Follow up on 09/03/2016.   Why:  Intake appointment on Feb. 5th at 7:45am.   Contact information: [address] [place] [address] [phone]          Follow-up recommendations: Activity:  As tolerated Diet: As recommended by your primary care doctor. Keep all scheduled follow-up appointments as recommended.  Comments: Patient is instructed prior to discharge to: Take all medications as prescribed by his/her mental healthcare provider. Report any adverse effects and or reactions from the medicines to his/her outpatient provider promptly. Patient has been instructed & cautioned: To not engage in alcohol and or illegal drug use while on prescription medicines. In the event of worsening symptoms, patient is instructed to call the crisis hotline, 911 and or go to the nearest ED for appropriate evaluation and treatment of symptoms. To follow-up with his/her primary care  provider for your other medical issues, concerns and or health care needs.   Signed: Sanjuana Kava, NP, PMHNP, FNP-BC 08/29/2016, 1:19 PM   Patient seen, Suicide Assessment Completed.  Disposition Plan Reviewed

## 2019-03-05 ENCOUNTER — Other Ambulatory Visit: Payer: Self-pay | Admitting: Behavioral Health

## 2019-03-05 ENCOUNTER — Other Ambulatory Visit: Payer: Self-pay

## 2019-03-05 ENCOUNTER — Encounter (HOSPITAL_COMMUNITY): Payer: Self-pay | Admitting: Emergency Medicine

## 2019-03-05 ENCOUNTER — Emergency Department (HOSPITAL_COMMUNITY): Payer: No Typology Code available for payment source

## 2019-03-05 ENCOUNTER — Emergency Department (HOSPITAL_COMMUNITY)
Admission: EM | Admit: 2019-03-05 | Discharge: 2019-03-05 | Disposition: A | Discharge status: Other Acute Inpatient Hospital | Payer: No Typology Code available for payment source | Attending: Emergency Medicine | Admitting: Emergency Medicine

## 2019-03-05 ENCOUNTER — Inpatient Hospital Stay (HOSPITAL_COMMUNITY)
Admission: AD | Admit: 2019-03-05 | Discharge: 2019-03-09 | DRG: 885 | Disposition: A | Discharge status: Home / Self Care | Payer: Federal, State, Local not specified - Other | Source: Intra-hospital | Attending: Psychiatry | Admitting: Psychiatry

## 2019-03-05 DIAGNOSIS — F1123 Opioid dependence with withdrawal: Secondary | ICD-10-CM | POA: Diagnosis present

## 2019-03-05 DIAGNOSIS — R45851 Suicidal ideations: Secondary | ICD-10-CM | POA: Diagnosis not present

## 2019-03-05 DIAGNOSIS — Z20828 Contact with and (suspected) exposure to other viral communicable diseases: Secondary | ICD-10-CM | POA: Insufficient documentation

## 2019-03-05 DIAGNOSIS — R1084 Generalized abdominal pain: Secondary | ICD-10-CM | POA: Insufficient documentation

## 2019-03-05 DIAGNOSIS — R6883 Chills (without fever): Secondary | ICD-10-CM | POA: Diagnosis not present

## 2019-03-05 DIAGNOSIS — Z79899 Other long term (current) drug therapy: Secondary | ICD-10-CM

## 2019-03-05 DIAGNOSIS — A6000 Herpesviral infection of urogenital system, unspecified: Secondary | ICD-10-CM | POA: Diagnosis present

## 2019-03-05 DIAGNOSIS — R0789 Other chest pain: Secondary | ICD-10-CM | POA: Insufficient documentation

## 2019-03-05 DIAGNOSIS — R001 Bradycardia, unspecified: Secondary | ICD-10-CM | POA: Diagnosis present

## 2019-03-05 DIAGNOSIS — K219 Gastro-esophageal reflux disease without esophagitis: Secondary | ICD-10-CM | POA: Diagnosis present

## 2019-03-05 DIAGNOSIS — F329 Major depressive disorder, single episode, unspecified: Secondary | ICD-10-CM | POA: Diagnosis present

## 2019-03-05 DIAGNOSIS — F17210 Nicotine dependence, cigarettes, uncomplicated: Secondary | ICD-10-CM | POA: Insufficient documentation

## 2019-03-05 DIAGNOSIS — F323 Major depressive disorder, single episode, severe with psychotic features: Principal | ICD-10-CM | POA: Diagnosis present

## 2019-03-05 DIAGNOSIS — F19988 Other psychoactive substance use, unspecified with other psychoactive substance-induced disorder: Secondary | ICD-10-CM | POA: Diagnosis not present

## 2019-03-05 DIAGNOSIS — F12259 Cannabis dependence with psychotic disorder, unspecified: Secondary | ICD-10-CM | POA: Diagnosis present

## 2019-03-05 DIAGNOSIS — R443 Hallucinations, unspecified: Secondary | ICD-10-CM | POA: Insufficient documentation

## 2019-03-05 DIAGNOSIS — F1110 Opioid abuse, uncomplicated: Secondary | ICD-10-CM | POA: Insufficient documentation

## 2019-03-05 DIAGNOSIS — F1290 Cannabis use, unspecified, uncomplicated: Secondary | ICD-10-CM | POA: Insufficient documentation

## 2019-03-05 DIAGNOSIS — F09 Unspecified mental disorder due to known physiological condition: Secondary | ICD-10-CM | POA: Diagnosis present

## 2019-03-05 DIAGNOSIS — F333 Major depressive disorder, recurrent, severe with psychotic symptoms: Secondary | ICD-10-CM | POA: Diagnosis not present

## 2019-03-05 DIAGNOSIS — F11259 Opioid dependence with opioid-induced psychotic disorder, unspecified: Secondary | ICD-10-CM | POA: Diagnosis present

## 2019-03-05 LAB — RAPID URINE DRUG SCREEN, HOSP PERFORMED
Amphetamines: NOT DETECTED
Barbiturates: NOT DETECTED
Benzodiazepines: NOT DETECTED
Cocaine: NOT DETECTED
Opiates: POSITIVE — AB
Tetrahydrocannabinol: POSITIVE — AB

## 2019-03-05 LAB — SARS CORONAVIRUS 2 BY RT PCR (HOSPITAL ORDER, PERFORMED IN ~~LOC~~ HOSPITAL LAB): SARS Coronavirus 2: NEGATIVE

## 2019-03-05 LAB — I-STAT CHEM 8, ED
BUN: 10 mg/dL (ref 6–20)
Calcium, Ion: 0.97 mmol/L — ABNORMAL LOW (ref 1.15–1.40)
Chloride: 101 mmol/L (ref 98–111)
Creatinine, Ser: 0.8 mg/dL (ref 0.61–1.24)
Glucose, Bld: 99 mg/dL (ref 70–99)
HCT: 46 % (ref 39.0–52.0)
Hemoglobin: 15.6 g/dL (ref 13.0–17.0)
Potassium: 3 mmol/L — ABNORMAL LOW (ref 3.5–5.1)
Sodium: 141 mmol/L (ref 135–145)
TCO2: 27 mmol/L (ref 22–32)

## 2019-03-05 LAB — CDS SEROLOGY

## 2019-03-05 LAB — COMPREHENSIVE METABOLIC PANEL
ALT: 11 U/L (ref 0–44)
AST: 16 U/L (ref 15–41)
Albumin: 4.2 g/dL (ref 3.5–5.0)
Alkaline Phosphatase: 54 U/L (ref 38–126)
Anion gap: 15 (ref 5–15)
BUN: 10 mg/dL (ref 6–20)
CO2: 23 mmol/L (ref 22–32)
Calcium: 8.8 mg/dL — ABNORMAL LOW (ref 8.9–10.3)
Chloride: 99 mmol/L (ref 98–111)
Creatinine, Ser: 0.86 mg/dL (ref 0.61–1.24)
GFR calc Af Amer: >60 mL/min (ref >60)
GFR calc non Af Amer: >60 mL/min (ref >60)
Glucose, Bld: 103 mg/dL — ABNORMAL HIGH (ref 70–99)
Potassium: 3 mmol/L — ABNORMAL LOW (ref 3.5–5.1)
Sodium: 137 mmol/L (ref 135–145)
Total Bilirubin: 0.9 mg/dL (ref 0.3–1.2)
Total Protein: 7.1 g/dL (ref 6.5–8.1)

## 2019-03-05 LAB — CBC
HCT: 44.7 % (ref 39.0–52.0)
Hemoglobin: 15.3 g/dL (ref 13.0–17.0)
MCH: 32.2 pg (ref 26.0–34.0)
MCHC: 34.2 g/dL (ref 30.0–36.0)
MCV: 94.1 fL (ref 80.0–100.0)
Platelets: 246 10*3/uL (ref 150–400)
RBC: 4.75 MIL/uL (ref 4.22–5.81)
RDW: 12.2 % (ref 11.5–15.5)
WBC: 13.2 10*3/uL — ABNORMAL HIGH (ref 4.0–10.5)
nRBC: 0 % (ref 0.0–0.2)

## 2019-03-05 LAB — URINALYSIS, ROUTINE W REFLEX MICROSCOPIC
Bilirubin Urine: NEGATIVE
Glucose, UA: NEGATIVE mg/dL
Hgb urine dipstick: NEGATIVE
Ketones, ur: NEGATIVE mg/dL
Leukocytes,Ua: NEGATIVE
Nitrite: NEGATIVE
Protein, ur: NEGATIVE mg/dL
Specific Gravity, Urine: >1.046 — ABNORMAL HIGH (ref 1.005–1.030)
pH: 7 (ref 5.0–8.0)

## 2019-03-05 LAB — ETHANOL: Alcohol, Ethyl (B): <10 mg/dL (ref <10)

## 2019-03-05 LAB — SAMPLE TO BLOOD BANK

## 2019-03-05 LAB — LACTIC ACID, PLASMA: Lactic Acid, Venous: 1.4 mmol/L (ref 0.5–1.9)

## 2019-03-05 LAB — PROTIME-INR
INR: 1.1 (ref 0.8–1.2)
Prothrombin Time: 14 seconds (ref 11.4–15.2)

## 2019-03-05 MED ORDER — METOCLOPRAMIDE HCL 5 MG/ML IJ SOLN
10.0000 mg | Freq: Once | INTRAMUSCULAR | Status: AC
  Administered 2019-03-05: 10 mg via INTRAMUSCULAR
  Filled 2019-03-05: qty 2

## 2019-03-05 MED ORDER — HYDROXYZINE HCL 25 MG PO TABS
25.0000 mg | ORAL_TABLET | Freq: Four times a day (QID) | ORAL | Status: DC | PRN
  Administered 2019-03-05: 12:00:00 25 mg via ORAL
  Filled 2019-03-05: qty 1

## 2019-03-05 MED ORDER — NICOTINE 21 MG/24HR TD PT24: 21.0000 mg | MEDICATED_PATCH | Freq: Every day | TRANSDERMAL | Status: DC

## 2019-03-05 MED ORDER — ONDANSETRON 4 MG PO TBDP: 4.0000 mg | ORAL_TABLET | Freq: Four times a day (QID) | ORAL | Status: DC | PRN

## 2019-03-05 MED ORDER — DICYCLOMINE HCL 20 MG PO TABS
20.0000 mg | ORAL_TABLET | Freq: Four times a day (QID) | ORAL | Status: DC | PRN
  Administered 2019-03-05 – 2019-03-08 (×7): 20 mg via ORAL
  Filled 2019-03-05 (×7): qty 1

## 2019-03-05 MED ORDER — ACETAMINOPHEN 325 MG PO TABS: 650.0000 mg | ORAL_TABLET | ORAL | Status: DC | PRN

## 2019-03-05 MED ORDER — ZOLPIDEM TARTRATE 5 MG PO TABS: 5.0000 mg | ORAL_TABLET | Freq: Every evening | ORAL | Status: DC | PRN

## 2019-03-05 MED ORDER — POTASSIUM CHLORIDE CRYS ER 20 MEQ PO TBCR
40.0000 meq | EXTENDED_RELEASE_TABLET | Freq: Once | ORAL | Status: AC
  Administered 2019-03-05: 40 meq via ORAL
  Filled 2019-03-05: qty 2

## 2019-03-05 MED ORDER — LOPERAMIDE HCL 2 MG PO CAPS: 2.0000 mg | ORAL_CAPSULE | ORAL | Status: DC | PRN

## 2019-03-05 MED ORDER — OLANZAPINE 5 MG PO TABS
5.0000 mg | ORAL_TABLET | Freq: Every day | ORAL | Status: DC
  Administered 2019-03-05: 21:00:00 5 mg via ORAL
  Filled 2019-03-05 (×2): qty 1
  Filled 2019-03-05: qty 2

## 2019-03-05 MED ORDER — NICOTINE 21 MG/24HR TD PT24
21.0000 mg | MEDICATED_PATCH | Freq: Every day | TRANSDERMAL | Status: DC
  Administered 2019-03-06 – 2019-03-09 (×4): 21 mg via TRANSDERMAL
  Filled 2019-03-05 (×6): qty 1

## 2019-03-05 MED ORDER — ALUM & MAG HYDROXIDE-SIMETH 200-200-20 MG/5ML PO SUSP: 30.0000 mL | Freq: Four times a day (QID) | ORAL | Status: DC | PRN

## 2019-03-05 MED ORDER — NAPROXEN 500 MG PO TABS
500.0000 mg | ORAL_TABLET | Freq: Two times a day (BID) | ORAL | Status: DC | PRN
  Administered 2019-03-05 – 2019-03-07 (×3): 500 mg via ORAL
  Filled 2019-03-05 (×3): qty 1

## 2019-03-05 MED ORDER — ZIPRASIDONE MESYLATE 20 MG IM SOLR: 20.0000 mg | INTRAMUSCULAR | Status: DC | PRN

## 2019-03-05 MED ORDER — OLANZAPINE 5 MG PO TBDP
5.0000 mg | ORAL_TABLET | Freq: Three times a day (TID) | ORAL | Status: DC | PRN
  Administered 2019-03-05 – 2019-03-08 (×6): 5 mg via ORAL
  Filled 2019-03-05 (×7): qty 1

## 2019-03-05 MED ORDER — ACETAMINOPHEN 325 MG PO TABS
650.0000 mg | ORAL_TABLET | Freq: Four times a day (QID) | ORAL | Status: DC | PRN
  Administered 2019-03-05: 12:00:00 650 mg via ORAL
  Filled 2019-03-05 (×2): qty 2

## 2019-03-05 MED ORDER — MAGNESIUM HYDROXIDE 400 MG/5ML PO SUSP: 30.0000 mL | Freq: Every day | ORAL | Status: DC | PRN

## 2019-03-05 MED ORDER — PROMETHAZINE HCL 25 MG/ML IJ SOLN
25.0000 mg | Freq: Once | INTRAMUSCULAR | Status: AC
  Administered 2019-03-05: 25 mg via INTRAMUSCULAR
  Filled 2019-03-05: qty 1

## 2019-03-05 MED ORDER — IOHEXOL 300 MG/ML  SOLN
100.0000 mL | Freq: Once | INTRAMUSCULAR | Status: AC | PRN
  Administered 2019-03-05: 100 mL via INTRAVENOUS

## 2019-03-05 MED ORDER — ONDANSETRON HCL 4 MG/2ML IJ SOLN
4.0000 mg | Freq: Once | INTRAMUSCULAR | Status: AC
  Administered 2019-03-05: 4 mg via INTRAVENOUS
  Filled 2019-03-05: qty 2

## 2019-03-05 MED ORDER — METHOCARBAMOL 500 MG PO TABS
500.0000 mg | ORAL_TABLET | Freq: Three times a day (TID) | ORAL | Status: DC | PRN
  Administered 2019-03-05 – 2019-03-08 (×7): 500 mg via ORAL
  Filled 2019-03-05 (×8): qty 1

## 2019-03-05 MED ORDER — POTASSIUM CHLORIDE CRYS ER 20 MEQ PO TBCR
20.0000 meq | EXTENDED_RELEASE_TABLET | Freq: Two times a day (BID) | ORAL | Status: AC
  Administered 2019-03-06 (×2): 20 meq via ORAL
  Filled 2019-03-05 (×4): qty 1

## 2019-03-05 MED ORDER — PNEUMOCOCCAL VAC POLYVALENT 25 MCG/0.5ML IJ INJ: 0.5000 mL | INJECTION | INTRAMUSCULAR | Status: DC

## 2019-03-05 MED ORDER — LORAZEPAM 2 MG/ML IJ SOLN
1.0000 mg | Freq: Once | INTRAMUSCULAR | Status: AC
  Administered 2019-03-05: 1 mg via INTRAVENOUS
  Filled 2019-03-05: qty 1

## 2019-03-05 MED ORDER — ONDANSETRON HCL 4 MG PO TABS: 4.0000 mg | ORAL_TABLET | Freq: Three times a day (TID) | ORAL | Status: DC | PRN

## 2019-03-05 MED ORDER — CITALOPRAM HYDROBROMIDE 10 MG PO TABS
10.0000 mg | ORAL_TABLET | Freq: Every day | ORAL | Status: DC
  Administered 2019-03-06 – 2019-03-09 (×4): 10 mg via ORAL
  Filled 2019-03-05 (×4): qty 1
  Filled 2019-03-05: qty 7
  Filled 2019-03-05: qty 1

## 2019-03-05 MED ORDER — LORAZEPAM 1 MG PO TABS
1.0000 mg | ORAL_TABLET | Freq: Once | ORAL | Status: AC
  Administered 2019-03-05: 1 mg via ORAL
  Filled 2019-03-05: qty 1

## 2019-03-05 MED ORDER — ALUM & MAG HYDROXIDE-SIMETH 200-200-20 MG/5ML PO SUSP
30.0000 mL | ORAL | Status: DC | PRN
  Administered 2019-03-08: 13:00:00 30 mL via ORAL
  Filled 2019-03-05: qty 30

## 2019-03-05 MED ORDER — LORAZEPAM 1 MG PO TABS
1.0000 mg | ORAL_TABLET | ORAL | Status: AC | PRN
  Administered 2019-03-05: 1 mg via ORAL
  Filled 2019-03-05: qty 1
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
## 2019-03-05 NOTE — ED Notes (Signed)
Pt very combative in room.  Jumped out of bed, ripped off all his monitoring equipment, not complying w/ any requests.  Pt attempting to take off collar, pulling over his head.  Provider bedside.

## 2019-03-05 NOTE — Tx Team (Signed)
Initial Treatment Plan 03/05/2019 6:57 PM Anthony Haney BJS:[id number]    PATIENT STRESSORS: Occupational concerns Substance abuse   PATIENT STRENGTHS: Capable of independent living Supportive family/friends   PATIENT IDENTIFIED PROBLEMS: Substance Abuse  Anxiety  Anger  Panic Attacks               DISCHARGE CRITERIA:  Ability to meet basic life and health needs Improved stabilization in mood, thinking, and/or behavior Reduction of life-threatening or endangering symptoms to within safe limits Verbal commitment to aftercare and medication compliance Withdrawal symptoms are absent or subacute and managed without 24-hour nursing intervention  PRELIMINARY DISCHARGE PLAN: Outpatient therapy Return to previous living arrangement  PATIENT/FAMILY INVOLVEMENT: This treatment plan has been presented to and reviewed with the patient, Anthony Haney, and/or family member.  The patient and family have been given the opportunity to ask questions and make suggestions.  Harriet Masson, RN 03/05/2019, 6:57 PM
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
## 2019-03-05 NOTE — ED Notes (Addendum)
Patient transported to CT, pt slightly calmer at this time.

## 2019-03-05 NOTE — ED Notes (Signed)
Valuable w/ security
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
## 2019-03-05 NOTE — ED Notes (Signed)
Pt given sandwich and water.
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
## 2019-03-05 NOTE — ED Notes (Signed)
Personal items, 2 bags, stored in locker number 8

## 2019-03-05 NOTE — Progress Notes (Signed)
Patient ID: Anthony Haney, male   DOB: July 25, 1981, 38 y.o.   MRN: [id number]  [place] NOVEL CORONAVIRUS (COVID-19) DAILY CHECK-OFF SYMPTOMS - answer yes or no to each - every day NO YES  Have you had a fever in the past 24 hours?  . Fever (Temp > 37.80C / 100F) X   Have you had any of these symptoms in the past 24 hours? . New Cough .  Sore Throat  .  Shortness of Breath .  Difficulty Breathing .  Unexplained Body Aches   X   Have you had any one of these symptoms in the past 24 hours not related to allergies?   . Runny Nose .  Nasal Congestion .  Sneezing   X   If you have had runny nose, nasal congestion, sneezing in the past 24 hours, has it worsened?  X   EXPOSURES - check yes or no X   Have you traveled outside the state in the past 14 days?  X   Have you been in contact with someone with a confirmed diagnosis of COVID-19 or PUI in the past 14 days without wearing appropriate PPE?  X   Have you been living in the same home as a person with confirmed diagnosis of COVID-19 or a PUI (household contact)?    X   Have you been diagnosed with COVID-19?    X              What to do next: Answered NO to all: Answered YES to anything:   Proceed with unit schedule Follow the [facility] Inpatient Flowsheet.

## 2019-03-05 NOTE — ED Notes (Signed)
Xray bedside.
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
## 2019-03-06 DIAGNOSIS — F19988 Other psychoactive substance use, unspecified with other psychoactive substance-induced disorder: Secondary | ICD-10-CM

## 2019-03-06 DIAGNOSIS — F09 Unspecified mental disorder due to known physiological condition: Secondary | ICD-10-CM

## 2019-03-06 MED ORDER — TRAZODONE HCL 100 MG PO TABS
200.0000 mg | ORAL_TABLET | Freq: Every day | ORAL | Status: DC
  Administered 2019-03-06 – 2019-03-08 (×3): 200 mg via ORAL
  Filled 2019-03-06 (×4): qty 2

## 2019-03-06 MED ORDER — ONDANSETRON 4 MG PO TBDP
4.0000 mg | ORAL_TABLET | Freq: Three times a day (TID) | ORAL | Status: DC | PRN
  Administered 2019-03-06 – 2019-03-08 (×3): 4 mg via ORAL
  Filled 2019-03-06 (×3): qty 1

## 2019-03-06 MED ORDER — RISPERIDONE 2 MG PO TABS
2.0000 mg | ORAL_TABLET | Freq: Two times a day (BID) | ORAL | Status: DC
  Administered 2019-03-06 – 2019-03-09 (×7): 2 mg via ORAL
  Filled 2019-03-06: qty 14
  Filled 2019-03-06 (×10): qty 1
  Filled 2019-03-06: qty 14

## 2019-03-06 MED ORDER — ENSURE ENLIVE PO LIQD
237.0000 mL | Freq: Two times a day (BID) | ORAL | Status: DC
  Administered 2019-03-06 – 2019-03-07 (×3): 237 mL via ORAL

## 2019-03-06 MED ORDER — AMANTADINE HCL 100 MG PO CAPS
100.0000 mg | ORAL_CAPSULE | Freq: Two times a day (BID) | ORAL | Status: DC
  Administered 2019-03-06 – 2019-03-09 (×7): 100 mg via ORAL
  Filled 2019-03-06 (×6): qty 1
  Filled 2019-03-06: qty 14
  Filled 2019-03-06 (×3): qty 1
  Filled 2019-03-06: qty 14
  Filled 2019-03-06: qty 1

## 2019-03-06 MED ORDER — TRAZODONE HCL 50 MG PO TABS
50.0000 mg | ORAL_TABLET | Freq: Once | ORAL | Status: AC
  Administered 2019-03-06: 50 mg via ORAL
  Filled 2019-03-06 (×2): qty 1

## 2019-03-06 MED ORDER — ONDANSETRON 4 MG PO TBDP
4.0000 mg | ORAL_TABLET | Freq: Once | ORAL | Status: AC
  Administered 2019-03-06: 4 mg via ORAL
  Filled 2019-03-06 (×2): qty 1

## 2019-03-06 NOTE — Progress Notes (Signed)
D: Pt was at medication window upon initial approach.  Pt presents with irritable, anxious affect and mood.  His goal is to "get some sleep."  Pt denies SI/HI, denies hallucinations.  He reports "I don't know what's real."  Pt has been isolative to his room for the majority of the evening.  He complains of muscle spasms, abdominal cramps, agitation, and emesis X1 which was witnessed by staff.  A: Met with pt 1:1.  Actively listened to pt and offered support and encouragement. Medication administered per order.  PRN medication administered for muscle spasms, abdominal cramps, agitation.  On-site provider notified that pt vomited and new order was received.  PRN medication administered for vomiting.  15 minute safety checks in place.  R: Pt is safe on the unit.  Pt is compliant with medications.  Pt verbally contracts for safety.  Will continue to monitor and assess.

## 2019-03-06 NOTE — Tx Team (Signed)
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 acetaminophen (TYLENOL) tablet 650 mg  650 mg Oral Q6H PRN Mordecai Maes, NP   650 mg at 03/05/19 1144  . alum & mag hydroxide-simeth (MAALOX/MYLANTA) 200-200-20 MG/5ML suspension 30 mL  30 mL Oral Q4H PRN Mordecai Maes, NP      . amantadine (SYMMETREL) capsule 100 mg  100 mg Oral BID [name], MD   100 mg at 03/06/19 0814  . citalopram (CELEXA) tablet 10 mg  10 mg Oral Daily [name], [name], MD   10 mg at 03/06/19 7106  . dicyclomine (BENTYL) tablet 20 mg  20 mg Oral Q6H PRN Mordecai Maes, NP   20 mg at 03/06/19 0001  . feeding supplement (ENSURE ENLIVE) (ENSURE ENLIVE) liquid 237 mL  237 mL Oral BID BM [name], [name], MD      . magnesium hydroxide (MILK OF MAGNESIA) suspension 30 mL  30 mL Oral Daily PRN Mordecai Maes, NP      . methocarbamol (ROBAXIN) tablet 500 mg  500 mg Oral Q8H PRN Mordecai Maes, NP   500 mg at 03/06/19 0814  . naproxen (NAPROSYN) tablet 500 mg  500 mg Oral BID PRN Mordecai Maes, NP   500 mg at 03/06/19 0001  . nicotine (NICODERM CQ - dosed in mg/24 hours) patch 21 mg  21 mg Transdermal Daily [name], [name], MD   21 mg at 03/06/19 2694  . OLANZapine zydis (ZYPREXA) disintegrating tablet 5 mg  5 mg Oral Q8H PRN [name], [name], MD   5 mg at 03/05/19 1800   And  . ziprasidone (GEODON) injection 20 mg  20 mg Intramuscular PRN [name], [name], MD      . pneumococcal 23 valent vaccine (PNU-IMMUNE) injection 0.5 mL  0.5 mL Intramuscular Tomorrow-1000 [name], [name], MD      . potassium chloride SA  (K-DUR) CR tablet 20 mEq  20 mEq Oral BID [name], [name], MD   20 mEq at 03/06/19 8546  . risperiDONE (RISPERDAL) tablet 2 mg  2 mg Oral BID [name], MD   2 mg at 03/06/19 2703  . traZODone (DESYREL) tablet 200 mg  200 mg Oral QHS [name], MD       PTA Medications: Medications Prior to Admission  Medication Sig Dispense Refill Last Dose  . citalopram (CELEXA) 10 MG tablet Take 1 tablet (10 mg total) by mouth daily. For depression (Patient not taking: Reported on 03/05/2019) 30 tablet 0   . gabapentin (NEURONTIN) 100 MG capsule Take 1 capsule (100 mg total) by mouth 3 (three) times daily. For agitation (Patient not taking: Reported on 03/05/2019) 90 capsule 0   . hydrOXYzine (ATARAX/VISTARIL) 25 MG tablet Take 1 tablet (25 mg total) by mouth 3 (three) times daily as needed for anxiety. (Patient not taking: Reported on 03/05/2019) 60 tablet 0   . nicotine polacrilex (NICORETTE) 2 MG gum Take 1 each (2 mg total) by mouth as needed for smoking cessation. (Patient not taking: Reported on 03/05/2019) 100 tablet 0   . pantoprazole (PROTONIX) 40 MG tablet Take 1 tablet (40 mg total) by mouth daily. For acid reflux (  Patient not taking: Reported on 03/05/2019) 30 tablet 0   . traZODone (DESYREL) 50 MG tablet Take 1 tablet (50 mg) at bedtime: for sleep (Patient not taking: Reported on 03/05/2019) 30 tablet 0   . valACYclovir (VALTREX) 500 MG tablet Take 1 tablet (500 mg total) by mouth daily. For herpes outbreak 12 tablet 0     Patient Stressors: Occupational concerns Substance abuse  Patient Strengths: Capable of independent living Supportive family/friends  Treatment Modalities: Medication Management, Group therapy, Case management,  1 to 1 session with clinician, Psychoeducation, Recreational therapy.   Physician Treatment Plan for Primary Diagnosis: <principal problem not specified> Long Term Goal(s): Improvement in symptoms so as ready for discharge Improvement in symptoms so as ready for  discharge   Short Term Goals: Ability to identify changes in lifestyle to reduce recurrence of condition will improve Ability to verbalize feelings will improve Ability to disclose and discuss suicidal ideas Ability to demonstrate self-control will improve Ability to identify and develop effective coping behaviors will improve Ability to maintain clinical measurements within normal limits will improve Ability to identify changes in lifestyle to reduce recurrence of condition will improve Ability to verbalize feelings will improve Ability to disclose and discuss suicidal ideas Ability to demonstrate self-control will improve Ability to identify and develop effective coping behaviors will improve Ability to maintain clinical measurements within normal limits will improve Compliance with prescribed medications will improve Ability to identify triggers associated with substance abuse/mental health issues will improve  Medication Management: Evaluate patient's response, side effects, and tolerance of medication regimen.  Therapeutic Interventions: 1 to 1 sessions, Unit Group sessions and Medication administration.  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 ([place])  Long Term Goal(s): Improvement in symptoms so as ready for discharge Improvement in symptoms so as ready for discharge   Short Term Goals: Ability to identify changes in lifestyle to reduce recurrence of condition will improve Ability to verbalize feelings will improve Ability to disclose and discuss suicidal ideas Ability to demonstrate self-control will improve Ability to identify and develop effective coping behaviors will improve Ability to maintain clinical measurements within normal limits will improve Ability to identify changes in lifestyle to reduce recurrence of condition  will improve Ability to verbalize feelings will improve Ability to disclose and discuss suicidal ideas Ability to demonstrate self-control will improve Ability to identify and develop effective coping behaviors will improve Ability to maintain clinical measurements within normal limits will improve Compliance with prescribed medications will improve Ability to identify triggers associated with substance abuse/mental health issues will improve     Medication Management: Evaluate patient's response, side effects, and tolerance of medication regimen.  Therapeutic Interventions: 1 to 1 sessions, Unit Group sessions and Medication administration.  Evaluation of Outcomes: Not Met   RN Treatment Plan for Primary Diagnosis: <principal problem not specified> Long Term Goal(s): Knowledge of disease and therapeutic regimen to maintain health will improve  Short Term Goals: Ability to demonstrate self-control, Ability to participate in decision making will improve, Ability to identify and develop effective coping behaviors will improve and Compliance with prescribed medications will improve  Medication Management: RN will administer medications as ordered by provider, will assess and evaluate patient's response and provide education to patient for prescribed medication. RN will report any adverse and/or side effects to prescribing provider.  Therapeutic Interventions: 1 on 1 counseling sessions, Psychoeducation,  Medication administration, Evaluate responses to treatment, Monitor vital signs and CBGs as ordered, Perform/monitor CIWA, COWS, AIMS and Fall Risk screenings as ordered, Perform wound care treatments as ordered.  Evaluation of Outcomes: Not Met   LCSW Treatment Plan for Primary Diagnosis: <principal problem not specified> Long Term Goal(s): Safe transition to appropriate next level of care at discharge, Engage patient in therapeutic group addressing interpersonal concerns.  Short Term  Goals: Engage patient in aftercare planning with referrals and resources, Increase social support, Identify triggers associated with mental health/substance abuse issues and Increase skills for wellness and recovery  Therapeutic Interventions: Assess for all discharge needs, 1 to 1 time with Social worker, Explore available resources and support systems, Assess for adequacy in community support network, Educate family and significant other(s) on suicide prevention, Complete Psychosocial Assessment, Interpersonal group therapy.  Evaluation of Outcomes: Not Met   Progress in Treatment: Attending groups: No. New to unit Participating in groups: No. Taking medication as prescribed: Yes. Toleration medication: Yes. Family/Significant other contact made: No, will contact:  supports if consents are granted. Patient understands diagnosis: No. Discussing patient identified problems/goals with staff: Yes. Medical problems stabilized or resolved: No. Denies suicidal/homicidal ideation: Yes. Issues/concerns per patient self-inventory: Yes.  New problem(s) identified: Yes, Describe:  legal issues- court on 08/11 for DUI  New Short Term/Long Term Goal(s): detox, medication management for mood stabilization; elimination of SI thoughts; development of comprehensive mental wellness/sobriety plan.  Patient Goals:    Discharge Plan or Barriers: CSW continuing to assess for appropriate referrals.  Reason for Continuation of Hospitalization: Anxiety Depression Medication stabilization Withdrawal symptoms  Estimated Length of Stay: 3-5 days  Attendees: Patient:  03/06/2019 8:43 AM  Physician: Dr.Farah 03/06/2019 8:43 AM  Nursing: Estill Bamberg, RN 03/06/2019 8:43 AM  RN Care Manager: 03/06/2019 8:43 AM  Social Worker: Stephanie Acre, [place] 03/06/2019 8:43 AM  Recreational Therapist:  03/06/2019 8:43 AM  Other:  03/06/2019 8:43 AM  Other:  03/06/2019 8:43 AM  Other: 03/06/2019 8:43 AM    Scribe for Treatment  Team: Joellen Jersey, [place] 03/06/2019 8:43 AM

## 2019-03-06 NOTE — Plan of Care (Signed)
  Problem: Coping: Goal: Ability to demonstrate self-control will improve Outcome: Progressing   Problem: Health Behavior/Discharge Planning: Goal: Compliance with treatment plan for underlying cause of condition will improve Outcome: Progressing   Problem: Physical Regulation: Goal: Ability to maintain clinical measurements within normal limits will improve Outcome: Progressing   Problem: Safety: Goal: Periods of time without injury will increase Outcome: Progressing   
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
## 2019-03-06 NOTE — Progress Notes (Signed)
Pt came to nurse's station complaining of insomnia, pain, stomach cramps, and nausea.  PRN medication administered for pain and stomach cramps.  On-site provider contacted regarding pt's other complaints of zofran 4 mg sublingual X1 was ordered and administered along with Trazodone 50 mg POX1.  Pt stated "thank you" and returned to bed.  Will continue to monitor and assess.
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
## 2019-03-06 NOTE — Progress Notes (Signed)
Patient ID: Anthony Haney, male   DOB: 1980-11-09, 38 y.o.   MRN: [id number]  Nursing Progress Note [id number]  Patient presents paranoid, anxious and is easily distracted during interactions. Patient reports he feels anxious this morning. Patient complains of body aches due to withdrawal and requests PRN Robaxin. Patient compliant with scheduled medications. Patient's eyes appear to dart everywhere during conversations. Patient is isolative to his room but assertive with his needs. Patient currently denies SI/HI/AVH. Patient declined to complete self-inventory sheet.  Patient safety maintained with q15 min safety checks.   Patient remains safe on the unit at this time. Will continue to support and monitor with plan of care.

## 2019-03-06 NOTE — Progress Notes (Signed)
Patient approached CSW at nurses desk to ask for assistance in calling supports. "I keep dialing the numbers, but I don't know what's real and what's not." Patient maintains poor eye contact and presents as anxious and paranoid.  Stephanie Acre, LCSW-A Clinical Social Worker

## 2019-03-07 DIAGNOSIS — F333 Major depressive disorder, recurrent, severe with psychotic symptoms: Secondary | ICD-10-CM

## 2019-03-07 MED ORDER — PROMETHAZINE HCL 25 MG/ML IJ SOLN
12.5000 mg | Freq: Once | INTRAMUSCULAR | Status: AC
  Administered 2019-03-07: 02:00:00 12.5 mg via INTRAMUSCULAR
  Filled 2019-03-07 (×2): qty 1

## 2019-03-07 NOTE — Progress Notes (Signed)
Pt vomited again and attributes this to "reflux."  On-site provider notified and Phenergan 12.5 mg IM X1 was ordered and administered.  Pt cooperative with IM administration.  Pt provided with ginger ale.  He denies further needs and concerns at this time.  Will continue to monitor and assess.

## 2019-03-07 NOTE — Progress Notes (Addendum)
[facility]  03/07/2019 11:41 AM Anthony Haney  MRN:  [id number]   Subjective: Patient reports today that he is feeling tired.  He continues to report that he feels like he is looking at things from outside in.  He feels like he is in some type a TV show and that he is having difficulty distinguishing reality from make-believe.  He does deny any suicidal or homicidal ideations and denies any hallucinations.  Patient denies any medication side effects.  Patient states "okay" for improved sleep last night.  He did report that he is feeling a little bit nauseous today and he is offered Zofran but the patient refused it.  Objective: Patient's chart and findings reviewed and discussed with treatment team.  Patient presents in his room lying in his bed in the fetal position.  He appears disheveled with his sheets pulled off of the bed and wrapped around him.  He continues to endorse disassociation but is able to describe it in detail and is realizing that it is not really happening.  Nursing staff reported that the patient had been taking some Zofran ODT but the patient was swallowing the pill instead of letting dissolve under his tongue.  Patient is now refusing any Zofran for his nausea.  It was also reported that the patient had not been taking his medications with food or eating his meals completely.  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 Drug abuse (HCC)   . Hepatitis C   . Herpes     Past Surgical History:  Procedure Laterality Date  . HAND SURGERY     Left   Family History: History reviewed. No pertinent family history. Family Psychiatric  History: See H&P Social History:  Social History    Substance and Sexual Activity  Alcohol Use Yes   Comment: 2 per month     Social History   Substance and Sexual Activity  Drug Use Yes  . Types: IV, Benzodiazepines, Cocaine, Marijuana, Heroin   Comment: "as often as possible"    Social History   Socioeconomic History  . Marital status: Single    Spouse name: Not on file  . Number of children: Not on file  . Years of education: Not on file  . Highest education level: Not on file  Occupational History  . Not on file  Social Needs  . Financial resource strain: Not on file  . Food insecurity    Worry: Not on file    Inability: Not on file  . Transportation needs    Medical: Not on file    Non-medical: Not on file  Tobacco Use  . Smoking status: Current Every Day Smoker    Packs/day: 1.00    Years: 10.00    Pack years: 10.00    Types: Cigarettes  . Smokeless tobacco: Never Used  Substance and Sexual Activity  . Alcohol use: Yes    Comment: 2 per month  . Drug use: Yes    Types: IV, Benzodiazepines, Cocaine, Marijuana, Heroin    Comment: "as often as possible"  . Sexual activity: Yes    Birth control/protection: Condom  Lifestyle  . Physical activity    Days per week: Not on file  Minutes per session: Not on file  . Stress: Not on file  Relationships  . Social Herbalist on phone: Not on file    Gets together: Not on file    Attends religious service: Not on file    Active member of club or organization: Not on file    Attends meetings of clubs or organizations: Not on file    Relationship status: Not on file  Other Topics Concern  . Not on file  Social History Narrative  . Not on file   Additional Social History:                         Sleep: Fair  Appetite:  Poor  Current Medications: Current Facility-Administered Medications  Medication Dose Route Frequency Provider Last Rate Last Dose  . acetaminophen (TYLENOL) tablet 650 mg  650 mg Oral Q6H PRN Mordecai Maes, NP    650 mg at 03/05/19 1144  . alum & mag hydroxide-simeth (MAALOX/MYLANTA) 200-200-20 MG/5ML suspension 30 mL  30 mL Oral Q4H PRN Mordecai Maes, NP      . amantadine (SYMMETREL) capsule 100 mg  100 mg Oral BID [name], MD   100 mg at 03/07/19 0815  . citalopram (CELEXA) tablet 10 mg  10 mg Oral Daily Magenta Schmiesing, [name], MD   10 mg at 03/07/19 0815  . dicyclomine (BENTYL) tablet 20 mg  20 mg Oral Q6H PRN Mordecai Maes, NP   20 mg at 03/07/19 0815  . feeding supplement (ENSURE ENLIVE) (ENSURE ENLIVE) liquid 237 mL  237 mL Oral BID BM Kirstine Jacquin, [name], MD   237 mL at 03/06/19 0942  . magnesium hydroxide (MILK OF MAGNESIA) suspension 30 mL  30 mL Oral Daily PRN Mordecai Maes, NP      . methocarbamol (ROBAXIN) tablet 500 mg  500 mg Oral Q8H PRN Mordecai Maes, NP   500 mg at 03/07/19 0815  . naproxen (NAPROSYN) tablet 500 mg  500 mg Oral BID PRN Mordecai Maes, NP   500 mg at 03/07/19 0815  . nicotine (NICODERM CQ - dosed in mg/24 hours) patch 21 mg  21 mg Transdermal Daily Aramis Zobel, [name], MD   21 mg at 03/07/19 0815  . OLANZapine zydis (ZYPREXA) disintegrating tablet 5 mg  5 mg Oral Q8H PRN Mea Ozga, [name], MD   5 mg at 03/07/19 0815   And  . ziprasidone (GEODON) injection 20 mg  20 mg Intramuscular PRN Dollene Mallery, [name], MD      . ondansetron (ZOFRAN-ODT) disintegrating tablet 4 mg  4 mg Oral Q8H PRN Patriciaann Clan E, PA-C   4 mg at 03/06/19 2135  . pneumococcal 23 valent vaccine (PNU-IMMUNE) injection 0.5 mL  0.5 mL Intramuscular Tomorrow-1000 Karmine Kauer A, MD      . potassium chloride SA (K-DUR) CR tablet 20 mEq  20 mEq Oral BID Othal Kubitz, [name], MD   20 mEq at 03/06/19 1623  . risperiDONE (RISPERDAL) tablet 2 mg  2 mg Oral BID [name], MD   2 mg at 03/07/19 0815  . traZODone (DESYREL) tablet 200 mg  200 mg Oral QHS [name], MD   200 mg at 03/06/19 2113    Lab Results: No results found for this or any previous visit (from the past 22 hour(s)).  Blood Alcohol  level:  Lab Results  Component Value Date   ETH <10 03/05/2019   ETH <5 16/38/4665    Metabolic  Disorder Labs: Lab Results  Component Value Date   HGBA1C 4.7 (L) 08/25/2016   MPG 88 08/25/2016   Lab Results  Component Value Date   PROLACTIN 55.0 (H) 08/25/2016   Lab Results  Component Value Date   CHOL 162 08/25/2016   TRIG 205 (H) 08/25/2016   HDL 45 08/25/2016   CHOLHDL 3.6 08/25/2016   VLDL 41 (H) 08/25/2016   LDLCALC 76 08/25/2016    Physical Findings: AIMS: Facial and Oral Movements Muscles of Facial Expression: None, normal Lips and Perioral Area: None, normal Jaw: None, normal Tongue: None, normal,Extremity Movements Upper (arms, wrists, hands, fingers): None, normal Lower (legs, knees, ankles, toes): None, normal, Trunk Movements Neck, shoulders, hips: None, normal, Overall Severity Severity of abnormal movements (highest score from questions above): None, normal Incapacitation due to abnormal movements: None, normal Patient's awareness of abnormal movements (rate only patient's report): No Awareness, Dental Status Current problems with teeth and/or dentures?: No Does patient usually wear dentures?: No  CIWA:  CIWA-Ar Total: 5 COWS:  COWS Total Score: 29  Musculoskeletal: Strength & Muscle Tone: within normal limits Gait & Station: normal Patient leans: N/A  Psychiatric Specialty Exam: Physical Exam  Nursing note and vitals reviewed. Constitutional: He is oriented to person, place, and time. He appears well-developed and well-nourished.  Respiratory: Effort normal.  Musculoskeletal: Normal range of motion.  Neurological: He is alert and oriented to person, place, and time.    Review of Systems  Constitutional: Negative.   HENT: Negative.   Eyes: Negative.   Respiratory: Negative.   Cardiovascular: Negative.   Gastrointestinal: Negative.   Genitourinary: Negative.   Musculoskeletal: Negative.   Skin: Negative.   Neurological: Negative.    Endo/Heme/Allergies: Negative.   Psychiatric/Behavioral:       Disassociation    Blood pressure 97/65, pulse (!) 134, temperature 98.2 F (36.8 C), temperature source Oral, resp. rate 18, height 5\' 8"  (1.727 m), weight 52.2 kg, SpO2 98 %.Body mass index is 17.49 kg/m.  General Appearance: Disheveled  Eye Contact:  Minimal  Speech:  Clear and Coherent  Volume:  Decreased  Mood:  Depressed  Affect:  Flat  Thought Process:  Coherent and Descriptions of Associations: Intact  Orientation:  Full (Time, Place, and Person)  Thought Content:  Feels that he is disassociating  Suicidal Thoughts:  No  Homicidal Thoughts:  No  Memory:  Immediate;   Good Recent;   Fair Remote;   Fair  Judgement:  Fair  Insight:  Lacking  Psychomotor Activity:  Normal  Concentration:  Concentration: Fair  Recall:  Good  Fund of Knowledge:  Fair  Language:  Fair  Akathisia:  No  Handed:  Right  AIMS (if indicated):     Assets:  Desire for Improvement Financial Resources/Insurance Resilience  ADL's:  Intact  Cognition:  WNL  Sleep:  Number of Hours: 3.25   Problems addressed MDD 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 twice daily for EPS Continue Celexa 10 mg p.o. daily for depression Continue Risperdal 2 mg p.o. twice daily for dissociation and psychosis Continue trazodone 200 mg p.o. nightly for sleep and depression Continue Zyprexa as needed for acute psychosis Continue every 15 minute safety checks Monitor for withdrawal symptoms Encourage group therapy participation  Maryfrances Bunnellravis B Money, FNP 03/07/2019, 11:41 AM   Attest to NP Progress Note

## 2019-03-07 NOTE — Plan of Care (Signed)
Progress note  D: pt found in bed; compliant with medication administration. Pt states that he feels that life isn't real and that he doesn't know if this is a dream or not. Pt knows it is real but they state it feels like it isn't. Pt has complaints of pain, muscle spasms, anxiety, and agitation. Pt states he hasn't vomited since this morning. Pt has been resting most of the day still. Pt is pleasant but very anxious, hyperactive and pressured in his assessment. Pt states he has been diagnosed with bipolar before but it has never "stuck". Pt denies si/hi/ah/vh and verbally agrees to approach staff if these become apparent or before harming himself/others while at [place]: Pt provided support and encouragement. Pt given medication per protocol and standing orders. Q47m safety checks implemented and continued.  R: Pt safe on the unit. Will continue to monitor.  Pt progressing in the following metrics  Problem: Education: Goal: Knowledge of [facility] Education information/materials will improve Outcome: Progressing Goal: Emotional status will improve Outcome: Progressing Goal: Mental status will improve Outcome: Progressing Goal: Verbalization of understanding the information provided will improve Outcome: Progressing

## 2019-03-07 NOTE — Progress Notes (Signed)
D.  Pt states not feeling well, requested medication "for my stomach" upon approach.  Pt went to bed immediately after, requested that HS medication be brought to his room.  Pt got up around 2200 and got into shower, stated that it helps his anxiety.  Pt denies SI/HI/AVH at t his time.  Pt did not feel well enough to attend evening wrap up group. A.  Support and encouragement offered, medication given as ordered  R. Pt remains safe on the unit, will continue to monitor
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
## 2019-03-08 MED ORDER — PANTOPRAZOLE SODIUM 20 MG PO TBEC
20.0000 mg | DELAYED_RELEASE_TABLET | Freq: Every day | ORAL | Status: DC
  Administered 2019-03-08 – 2019-03-09 (×2): 20 mg via ORAL
  Filled 2019-03-08 (×3): qty 1

## 2019-03-08 NOTE — Progress Notes (Addendum)
D.  Pt pleasant on approach, but states that he has had an "aggravating" phone call with family and just wants to take whatever he can and go to bed.  Pt came back up for Trazodone later.  Pt states that his stomach has felt better today, reports no episodes of emesis but still some mld nausea.  Pt denies SI/HI/AVH at this time.  A.  Support and encouragement offered, medication given as ordered.  R.  Pt remains safe on the unit, will continue to monitor.    2252  Pt does talk to self in room at night, appears to be responding to internal stimuli.

## 2019-03-08 NOTE — Plan of Care (Signed)
  Problem: Safety: Goal: Ability to remain free from injury will improve Outcome: Progressing   

## 2019-03-08 NOTE — Progress Notes (Addendum)
[facility]  03/08/2019 1:37 PM Adiel Diona FoleyCoy Carolan  MRN:  [id number]   Subjective: Patient denies any suicidal or homicidal ideations. He reports that he is not sure if he is having visual hallucinations and still feel that he cannot distinguish reality from things that are not real. He states that he is paranoid of using the phone because he can't tell if the person on the other end is real or just in his head. He denies any medication side effects and feels that the medications are helping. He reports sleeping better and his appetite is improving.  Objective: Patient's chart and findings reviewed and discussed with treatment team.  Patient presents in his room lying in his bed.  He continues to appear disheveled. He has been seen getting out of his bed to go get his medications. He has not had any complaints on the unit. He seems to still realize what is real, but reports that he can't tell the difference. He reported that there is a history of GERD and usually takes medications for I, but there is none ordered. Will order Protonix.   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 Drug abuse (HCC)   . Hepatitis C   . Herpes     Past Surgical History:  Procedure Laterality Date  . HAND SURGERY     Left   Family History: History reviewed. No pertinent family history. Family Psychiatric  History: See H&P Social History:  Social History   Substance and Sexual Activity  Alcohol Use Yes   Comment: 2 per month     Social History   Substance and Sexual Activity  Drug Use Yes  . Types: IV, Benzodiazepines, Cocaine, Marijuana, Heroin   Comment: "as often as possible"     Social History   Socioeconomic History  . Marital status: Single    Spouse name: Not on file  . Number of children: Not on file  . Years of education: Not on file  . Highest education level: Not on file  Occupational History  . Not on file  Social Needs  . Financial resource strain: Not on file  . Food insecurity    Worry: Not on file    Inability: Not on file  . Transportation needs    Medical: Not on file    Non-medical: Not on file  Tobacco Use  . Smoking status: Current Every Day Smoker    Packs/day: 1.00    Years: 10.00    Pack years: 10.00    Types: Cigarettes  . Smokeless tobacco: Never Used  Substance and Sexual Activity  . Alcohol use: Yes    Comment: 2 per month  . Drug use: Yes    Types: IV, Benzodiazepines, Cocaine, Marijuana, Heroin    Comment: "as often as possible"  . Sexual activity: Yes    Birth control/protection: Condom  Lifestyle  . Physical activity    Days per week: Not on file    Minutes per session: Not on file  . Stress: Not on file  Relationships  . Social Musicianconnections    Talks on phone: Not on file    Gets together: Not on file    Attends  religious service: Not on file    Active member of club or organization: Not on file    Attends meetings of clubs or organizations: Not on file    Relationship status: Not on file  Other Topics Concern  . Not on file  Social History Narrative  . Not on file   Additional Social History:                         Sleep: Fair  Appetite:  Fair  Current Medications: Current Facility-Administered Medications  Medication Dose Route Frequency Provider Last Rate Last Dose  . acetaminophen (TYLENOL) tablet 650 mg  650 mg Oral Q6H PRN Mordecai Maes, NP   650 mg at 03/05/19 1144  . alum & mag hydroxide-simeth (MAALOX/MYLANTA) 200-200-20 MG/5ML suspension 30 mL  30 mL Oral Q4H PRN Mordecai Maes, NP   30 mL at 03/08/19 1322  . amantadine (SYMMETREL) capsule 100 mg  100 mg Oral BID [name], MD   100 mg at 03/08/19 0820  . citalopram (CELEXA) tablet 10 mg  10 mg Oral Daily Render Marley, [name], MD   10 mg at 03/08/19 0820  . dicyclomine (BENTYL) tablet 20 mg  20 mg Oral Q6H PRN Mordecai Maes, NP   20 mg at 03/07/19 2030  . feeding supplement (ENSURE ENLIVE) (ENSURE ENLIVE) liquid 237 mL  237 mL Oral BID BM Syann Cupples, [name], MD   237 mL at 03/07/19 1709  . magnesium hydroxide (MILK OF MAGNESIA) suspension 30 mL  30 mL Oral Daily PRN Mordecai Maes, NP      . methocarbamol (ROBAXIN) tablet 500 mg  500 mg Oral Q8H PRN Mordecai Maes, NP   500 mg at 03/08/19 1159  . naproxen (NAPROSYN) tablet 500 mg  500 mg Oral BID PRN Mordecai Maes, NP   500 mg at 03/07/19 0815  . nicotine (NICODERM CQ - dosed in mg/24 hours) patch 21 mg  21 mg Transdermal Daily Kayshawn Ozburn, [name], MD   21 mg at 03/08/19 8756  . OLANZapine zydis (ZYPREXA) disintegrating tablet 5 mg  5 mg Oral Q8H PRN Hollis Oh, [name], MD   5 mg at 03/08/19 1159   And  . ziprasidone (GEODON) injection 20 mg  20 mg Intramuscular PRN Garyn Arlotta, [name], MD      . ondansetron (ZOFRAN-ODT) disintegrating tablet 4 mg  4 mg Oral Q8H PRN Patriciaann Clan E, PA-C   4 mg at 03/07/19 2031  . pantoprazole (PROTONIX) EC tablet 20 mg  20 mg Oral Daily Money, Lowry Ram, FNP   20 mg at 03/08/19 0837  . pneumococcal 23 valent vaccine (PNU-IMMUNE) injection 0.5 mL  0.5 mL Intramuscular Tomorrow-1000 Helen Cuff A, MD      . risperiDONE (RISPERDAL) tablet 2 mg  2 mg Oral BID [name], MD   2 mg at 03/08/19 0820  . traZODone (DESYREL) tablet 200 mg  200 mg Oral QHS [name], MD   200 mg at 03/07/19 2110    Lab Results: No results found for this or any previous visit (from the past 44 hour(s)).  Blood Alcohol level:  Lab Results  Component Value Date   [facility] <10 03/05/2019   ETH <5 43/32/9518    Metabolic Disorder Labs: Lab Results  Component Value Date   HGBA1C 4.7 (L) 08/25/2016   MPG 88 08/25/2016   Lab Results   Component Value Date   PROLACTIN 55.0 (H) 08/25/2016   Lab Results  Component Value Date   CHOL 162 08/25/2016   TRIG 205 (H) 08/25/2016   HDL 45 08/25/2016   CHOLHDL 3.6 08/25/2016   VLDL 41 (H) 08/25/2016   LDLCALC 76 08/25/2016    Physical Findings: AIMS: Facial and Oral Movements Muscles of Facial Expression: None, normal Lips and Perioral Area: None, normal Jaw: None, normal Tongue: None, normal,Extremity Movements Upper (arms, wrists, hands, fingers): None, normal Lower (legs, knees, ankles, toes): None, normal, Trunk Movements Neck, shoulders, hips: None, normal, Overall Severity Severity of abnormal movements (highest score from questions above): None, normal Incapacitation due to abnormal movements: None, normal Patient's awareness of abnormal movements (rate only patient's report): No Awareness, Dental Status Current problems with teeth and/or dentures?: No Does patient usually wear dentures?: No  CIWA:  CIWA-Ar Total: 5 COWS:  COWS Total Score: 8  Musculoskeletal: Strength & Muscle Tone: within normal limits Gait & Station: normal Patient leans: N/A  Psychiatric Specialty Exam: Physical Exam  Nursing note and vitals reviewed. Constitutional: He is oriented to person, place, and time. He appears well-developed and well-nourished.  Respiratory: Effort normal.  Musculoskeletal: Normal range of motion.  Neurological: He is alert and oriented to person, place, and time.    Review of Systems  Constitutional: Negative.   HENT: Negative.   Eyes: Negative.   Respiratory: Negative.   Cardiovascular: Negative.   Gastrointestinal: Negative.   Genitourinary: Negative.   Musculoskeletal: Negative.   Skin: Negative.   Neurological: Negative.   Endo/Heme/Allergies: Negative.   Psychiatric/Behavioral:       Disassociation    Blood pressure 98/66, pulse (!) 115, temperature 98.2 F (36.8 C), temperature source Oral, resp. rate 18, height 5\' 8"  (1.727 m), weight  52.2 kg, SpO2 98 %.Body mass index is 17.49 kg/m.  General Appearance: Disheveled  Eye Contact:  Fair  Speech:  Clear and Coherent  Volume:  Decreased  Mood:  Depressed  Affect:  Flat  Thought Process:  Coherent and Descriptions of Associations: Intact  Orientation:  Full (Time, Place, and Person)  Thought Content:  Paranoid Ideation  Suicidal Thoughts:  No  Homicidal Thoughts:  No  Memory:  Immediate;   Good Recent;   Fair Remote;   Fair  Judgement:  Fair  Insight:  Lacking  Psychomotor Activity:  Normal  Concentration:  Concentration: Fair  Recall:  Good  Fund of Knowledge:  Fair  Language:  Fair  Akathisia:  No  Handed:  Right  AIMS (if indicated):     Assets:  Desire for Improvement Financial Resources/Insurance Resilience  ADL's:  Intact  Cognition:  WNL  Sleep:  Number of Hours: 6.25   Problems addressed MDD 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 twice daily for EPS Continue Celexa 10 mg p.o. daily for depression Continue Risperdal 2 mg p.o. twice daily for dissociation and psychosis Continue trazodone 200 mg p.o. nightly for sleep and depression Continue Zyprexa as needed for acute psychosis Continue every 15 minute safety checks Monitor for withdrawal symptoms Encourage group therapy participation  Maryfrances Bunnellravis B Money, FNP 03/08/2019, 1:37 PM   Attest to NP Progress Note

## 2019-03-08 NOTE — Progress Notes (Signed)
Patient ID: Anthony Haney, male   DOB: [birth year], 38 y.o.   MRN: [id number]   Nursing Progress Note [id number]  Patient presents anxious and pressured on approach. Patient appearance is disheveled and his motor activity is figdety/restless. Patient states his stomach feels a little better and declines need for PRNs. Patient compliant with scheduled medications. Patient is isolative to his room but does get up for medications/meals. Patient assertive with his needs. Patient currently denies SI/HI/AVH.   Patient safety maintained with q15 min safety checks. Medications provided as ordered.  Patient remains safe on the unit at this time. Will continue to support and monitor with plan of care.   Patient's self-inventory sheet Rated Energy Level  Low  Rated Sleep  Poor  Rated Appetite  Poor  Rated Anxiety (0-10)  10  Rated Hopelessness (0-10)  10  Rated Depression (0-10)  10  Daily Goal  "diligence"  Any Additional Comments:  "hope to go to long term treatment"

## 2019-03-09 MED ORDER — MIRTAZAPINE 15 MG PO TABS: 15.0000 mg | ORAL_TABLET | Freq: Every day | ORAL | Status: DC

## 2019-03-09 MED ORDER — RISPERIDONE 2 MG PO TABS: 2.0000 mg | ORAL_TABLET | Freq: Two times a day (BID) | ORAL | 1 refills | Status: AC

## 2019-03-09 MED ORDER — AMANTADINE HCL 100 MG PO CAPS: 100.0000 mg | ORAL_CAPSULE | Freq: Two times a day (BID) | ORAL | 2 refills | Status: AC

## 2019-03-09 MED ORDER — MIRTAZAPINE 15 MG PO TABS: 15.0000 mg | ORAL_TABLET | Freq: Every day | ORAL | 2 refills | Status: AC

## 2019-03-09 NOTE — BHH Suicide Risk Assessment (Signed)
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 rate 18, height 5\' 8"  (1.727 m), weight 52.2 kg, SpO2 98 %.Body mass index is 17.49 kg/m.  General Appearance: Casual  Eye Contact::  Good  Speech:  Clear and Coherent409  Volume:  Normal  Mood:  Euthymic  Affect:  Constricted  Thought Process:  Coherent and Descriptions of Associations: Intact  Orientation:  Full (Time, Place, and Person)  Thought Content:  Tangential  Suicidal Thoughts:  No  Homicidal Thoughts:  No  Memory:  Recent;   Fair  Judgement:  Intact  Insight:  Fair  Psychomotor Activity:  Normal  Concentration:  Fair  Recall:  [employer] of Knowledge:Good  Language: Good  Akathisia:  Negative  Handed:  Right  AIMS (if indicated):     Assets:  Communication Skills Desire for Improvement  Sleep:  Number of Hours: 6.25  Cognition: WNL  ADL's:  Intact   Mental Status Per Nursing Assessment::   On Admission:  Suicidal ideation indicated by patient  Demographic Factors:  Male and Low socioeconomic status  Loss Factors: Decrease in vocational status  Historical Factors: Impulsivity  Risk Reduction Factors:   Religious beliefs about death  Continued Clinical Symptoms:  Alcohol/Substance Abuse/Dependencies  Cognitive Features That Contribute To Risk:  None    Suicide Risk:  Minimal: No identifiable suicidal ideation.  Patients presenting with no risk factors but with morbid ruminations; may be classified as minimal risk based  on the severity of the depressive symptoms     Plan Of Care/Follow-up recommendations:  Activity:  full  Annalysa Mohammad, MD 03/09/2019, 8:57 AM

## 2019-03-09 NOTE — Progress Notes (Signed)
Patient ID: Anthony Haney, male   DOB: 1980-09-27, 38 y.o.   MRN: [id number]  Discharge Note  Patient denies SI/HI and states readiness for discharge.  Written and verbal discharge instructions reviewed with the patient. Patient accepting to information and verbalized understanding with no concerns. All belongings returned to patient from the unit and secured lockers.  Patient was safely escorted to the lobby for discharge.   In the lobby, patient states he is missing a wallet, $20 and misc cards. These items were not on the [facility] Admission Belonging sheet, and patient had already signed the form in the search room. The only item listed on the belonging sheet was a cell phone which was returned to patient. Patient states he came from [facility]. Upon chart review, [facility] had valuables and two bags were stored in locker 8. Writer called security at [facility] and no current valuables stored there. Spoke to RN in purple pod who stated she would check locker #8 for patient's belongings and call writer back with an update. Patient informed of this and verbalized understanding. Patient states he would like to take his scheduled [place] and provided Probation officer with a number to contact if belongings were located at [facility]. Patient states he can return to [facility] tomorrow to retrieve them if needed. Awaiting a call back from [facility].

## 2019-03-09 NOTE — Tx Team (Signed)
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 acetaminophen (TYLENOL) tablet 650 mg  650 mg Oral Q6H PRN Mordecai Maes, NP   650 mg at 03/05/19 1144  . alum & mag hydroxide-simeth (MAALOX/MYLANTA) 200-200-20 MG/5ML suspension 30 mL  30 mL Oral Q4H PRN Mordecai Maes, NP   30 mL at 03/08/19 1322  . amantadine (SYMMETREL) capsule 100 mg  100 mg Oral BID [name], MD   100 mg at 03/09/19 0757  . citalopram (CELEXA) tablet 10 mg  10 mg Oral Daily [name], [name], MD   10 mg at 03/09/19 0757  . dicyclomine (BENTYL) tablet 20 mg  20 mg Oral Q6H PRN Mordecai Maes, NP   20 mg at 03/08/19 2004  . magnesium hydroxide (MILK OF MAGNESIA) suspension 30 mL  30 mL Oral Daily PRN Mordecai Maes, NP      . methocarbamol (ROBAXIN) tablet 500 mg  500 mg Oral Q8H PRN Mordecai Maes, NP   500 mg at 03/08/19 2004  . naproxen (NAPROSYN) tablet 500 mg  500 mg Oral BID PRN Mordecai Maes, NP   500 mg at 03/07/19 0815  . nicotine (NICODERM CQ - dosed in mg/24 hours) patch 21 mg  21 mg Transdermal Daily [name], [name], MD   21 mg at 03/09/19 0757  . OLANZapine zydis (ZYPREXA) disintegrating tablet 5 mg  5 mg Oral Q8H PRN [name], [name], MD   5 mg at 03/08/19 2004   And  . ziprasidone (GEODON) injection 20 mg  20 mg Intramuscular PRN [name], [name], MD      . ondansetron (ZOFRAN-ODT) disintegrating tablet 4 mg  4 mg Oral Q8H PRN Patriciaann Clan E, PA-C   4 mg at 03/08/19 2004  . pantoprazole (PROTONIX) EC tablet 20 mg  20 mg Oral Daily Money, Lowry Ram, FNP    20 mg at 03/09/19 0757  . pneumococcal 23 valent vaccine (PNU-IMMUNE) injection 0.5 mL  0.5 mL Intramuscular Tomorrow-1000 [name], [name], MD      . risperiDONE (RISPERDAL) tablet 2 mg  2 mg Oral BID [name], MD   2 mg at 03/09/19 0757  . traZODone (DESYREL) tablet 200 mg  200 mg Oral QHS [name], MD   200 mg at 03/08/19 2101   PTA Medications: Medications Prior to Admission  Medication Sig Dispense Refill Last Dose  . citalopram (CELEXA) 10 MG tablet Take 1 tablet (10 mg total) by mouth daily. For depression (Patient not taking: Reported on 03/05/2019) 30 tablet 0   . gabapentin (NEURONTIN) 100 MG capsule Take 1 capsule (100 mg total) by mouth 3 (three) times daily. For agitation (Patient not taking: Reported on 03/05/2019) 90 capsule 0   . hydrOXYzine (ATARAX/VISTARIL) 25 MG tablet Take 1 tablet (25 mg total) by mouth 3 (three) times daily as needed for anxiety. (Patient not taking: Reported on 03/05/2019) 60 tablet 0   . nicotine polacrilex (NICORETTE) 2 MG gum Take 1 each (2 mg total) by mouth as needed for smoking cessation. (Patient not taking: Reported on 03/05/2019) 100 tablet 0   . pantoprazole (PROTONIX) 40 MG tablet Take 1  tablet (40 mg total) by mouth daily. For acid reflux (Patient not taking: Reported on 03/05/2019) 30 tablet 0   . traZODone (DESYREL) 50 MG tablet Take 1 tablet (50 mg) at bedtime: for sleep (Patient not taking: Reported on 03/05/2019) 30 tablet 0   . valACYclovir (VALTREX) 500 MG tablet Take 1 tablet (500 mg total) by mouth daily. For herpes outbreak 12 tablet 0     Patient Stressors: Occupational concerns Substance abuse  Patient Strengths: Capable of independent living Supportive family/friends  Treatment Modalities: Medication Management, Group therapy, Case management,  1 to 1 session with clinician, Psychoeducation, Recreational therapy.   Physician Treatment Plan for Primary Diagnosis: <principal problem not specified> Long Term Goal(s): Improvement  in symptoms so as ready for discharge Improvement in symptoms so as ready for discharge   Short Term Goals: Ability to identify changes in lifestyle to reduce recurrence of condition will improve Ability to verbalize feelings will improve Ability to disclose and discuss suicidal ideas Ability to demonstrate self-control will improve Ability to identify and develop effective coping behaviors will improve Ability to maintain clinical measurements within normal limits will improve Ability to identify changes in lifestyle to reduce recurrence of condition will improve Ability to verbalize feelings will improve Ability to disclose and discuss suicidal ideas Ability to demonstrate self-control will improve Ability to identify and develop effective coping behaviors will improve Ability to maintain clinical measurements within normal limits will improve Compliance with prescribed medications will improve Ability to identify triggers associated with substance abuse/mental health issues will improve  Medication Management: Evaluate patient's response, side effects, and tolerance of medication regimen.  Therapeutic Interventions: 1 to 1 sessions, Unit Group sessions and Medication administration.  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)  Long Term Goal(s): Improvement in symptoms so as ready for discharge Improvement in symptoms so as ready for discharge   Short Term Goals: Ability to identify changes in lifestyle to reduce recurrence of condition will improve Ability to verbalize feelings will improve Ability to disclose and discuss suicidal ideas Ability to demonstrate self-control will improve Ability to identify and develop effective coping behaviors will improve Ability to maintain  clinical measurements within normal limits will improve Ability to identify changes in lifestyle to reduce recurrence of condition will improve Ability to verbalize feelings will improve Ability to disclose and discuss suicidal ideas Ability to demonstrate self-control will improve Ability to identify and develop effective coping behaviors will improve Ability to maintain clinical measurements within normal limits will improve Compliance with prescribed medications will improve Ability to identify triggers associated with substance abuse/mental health issues will improve     Medication Management: Evaluate patient's response, side effects, and tolerance of medication regimen.  Therapeutic Interventions: 1 to 1 sessions, Unit Group sessions and Medication administration.  Evaluation of Outcomes: Adequate for Discharge   RN Treatment Plan for Primary Diagnosis: <principal problem not specified> Long Term Goal(s): Knowledge of disease and therapeutic regimen to maintain health will improve  Short Term Goals: Ability to demonstrate self-control, Ability to participate in decision making will improve, Ability to identify and develop effective coping behaviors will improve and Compliance with prescribed medications will improve  Medication Management: RN will administer medications as ordered by provider, will assess and evaluate patient's response and provide education to patient for prescribed medication. RN will  report any adverse and/or side effects to prescribing provider.  Therapeutic Interventions: 1 on 1 counseling sessions, Psychoeducation, Medication administration, Evaluate responses to treatment, Monitor vital signs and CBGs as ordered, Perform/monitor CIWA, COWS, AIMS and Fall Risk screenings as ordered, Perform wound care treatments as ordered.  Evaluation of Outcomes: Adequate for Discharge   LCSW Treatment Plan for Primary Diagnosis: <principal problem not specified> Long Term  Goal(s): Safe transition to appropriate next level of care at discharge, Engage patient in therapeutic group addressing interpersonal concerns.  Short Term Goals: Engage patient in aftercare planning with referrals and resources, Increase social support, Identify triggers associated with mental health/substance abuse issues and Increase skills for wellness and recovery  Therapeutic Interventions: Assess for all discharge needs, 1 to 1 time with Social worker, Explore available resources and support systems, Assess for adequacy in community support network, Educate family and significant other(s) on suicide prevention, Complete Psychosocial Assessment, Interpersonal group therapy.  Evaluation of Outcomes: Adequate for Discharge   Progress in Treatment: Attending groups: No.  Participating in groups: No. Taking medication as prescribed: Yes. Toleration medication: Yes. Family/Significant other contact made: No, will contact:  pts father, when permission is granted Patient understands diagnosis: No. Discussing patient identified problems/goals with staff: Yes. Medical problems stabilized or resolved: Yes. Denies suicidal/homicidal ideation: Yes. Issues/concerns per patient self-inventory: Yes.  New problem(s) identified: Yes, Describe:  legal issues- court on 08/11 for DUI  New Short Term/Long Term Goal(s): detox, medication management for mood stabilization; elimination of SI thoughts; development of comprehensive mental wellness/sobriety plan.  Patient Goals:  "To figure out what is reality and what is not"  Discharge Plan or Barriers: CSW continuing to assess for appropriate referrals.  Reason for Continuation of Hospitalization: none  Estimated Length of Stay: Today 03/09/2019  Attendees: Patient:  03/09/2019 9:13 AM  Physician: Dr. Jama Flavorsobos 03/09/2019 9:13 AM  Nursing: Marchelle FolksAmanda, RN 03/09/2019 9:13 AM  RN Care Manager: 03/09/2019 9:13 AM  Social Worker: Iris Pertlivia Tesla Keeler, LCSW 03/09/2019 9:13  AM  Recreational Therapist:  03/09/2019 9:13 AM  Other:  03/09/2019 9:13 AM  Other:  03/09/2019 9:13 AM  Other: 03/09/2019 9:13 AM    Scribe for Treatment Team: Charlann Langelivia K Analys Ryden, LCSW 03/09/2019 9:13 AM

## 2019-03-09 NOTE — Progress Notes (Signed)
Patient ID: Anthony Haney, male   DOB: [birth year], 38 y.o.   MRN: [id number]  Spoke to Luellen Pucker, RN from [facility]. Patient's belongings not in locker #8 or found stored on the unit. Search room rechecked by writer, no belongings discovered. Writer has updated patient, patient verbalizes understanding.
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
## 2019-03-09 NOTE — Progress Notes (Signed)
  [facility] Adult Case Management Discharge Plan :  Will you be returning to the same living situation after discharge:  Yes,  home At discharge, do you have transportation home?: Yes,  [employer] at 11am Do you have the ability to pay for your medications: No. ; [facility] Release of information consent forms completed and in the chart;  Patient's signature needed at discharge.  Patient to Follow up at: Follow-up Information    [facility] Follow up on 03/11/2019.   Why: Telephonic hospital follow up appointment is Wednesday, 8/12 at 11:30a.  The provider will contact you.  Contact information: [place] [address] [phone]           Next level of care provider has access to [facility] and Suicide Prevention discussed: No.; SPE with pt  Have you used any form of tobacco in the last 30 days? (Cigarettes, Smokeless Tobacco, Cigars, and/or Pipes): Yes  Has patient been referred to the Quitline?: Patient refused referral  Patient has been referred for addiction treatment: Yes  Trecia Rogers, LCSW 03/09/2019, 11:09 AM

## 2019-03-09 NOTE — Progress Notes (Signed)
Patient ID: Anthony Haney, male   DOB: [birth year], 38 y.o.   MRN: [id number]   CSW scheduled [employer] for patient for 12:30pm. CSW notified pt's nurse.

## 2019-03-09 NOTE — [facility] Suicide Risk Assessment (Signed)
[facility] INPATIENT:  Family/Significant Other Suicide Prevention Education  Suicide Prevention Education:  Patient Refusal for Family/Significant Other Suicide Prevention Education: The patient Anthony Haney has refused to provide written consent for family/significant other to be provided Family/Significant Other Suicide Prevention Education during admission and/or prior to discharge.  Physician notified.  Trecia Rogers 03/09/2019, 9:21 AM
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
